# Patient Record
Sex: Female | Born: 1940 | Race: White | Hispanic: No | Smoking: Former smoker
Health system: Southern US, Community
[De-identification: ages and names within clinical notes are randomized; demographics above are authoritative.]

## PROBLEM LIST (undated history)

## (undated) DIAGNOSIS — E079 Disorder of thyroid, unspecified: Secondary | ICD-10-CM

## (undated) DIAGNOSIS — I10 Essential (primary) hypertension: Secondary | ICD-10-CM

## (undated) DIAGNOSIS — E785 Hyperlipidemia, unspecified: Secondary | ICD-10-CM

## (undated) HISTORY — PX: COLONOSCOPY: SHX174

---

## 2004-08-27 ENCOUNTER — Ambulatory Visit: Payer: Self-pay | Admitting: Internal Medicine

## 2005-11-18 ENCOUNTER — Ambulatory Visit: Payer: Self-pay | Admitting: Internal Medicine

## 2006-12-04 ENCOUNTER — Ambulatory Visit: Payer: Self-pay | Admitting: Family Medicine

## 2007-12-07 ENCOUNTER — Ambulatory Visit: Payer: Self-pay | Admitting: Internal Medicine

## 2009-01-23 ENCOUNTER — Ambulatory Visit: Payer: Self-pay | Admitting: Family Medicine

## 2011-11-14 ENCOUNTER — Ambulatory Visit: Payer: Self-pay | Admitting: Unknown Physician Specialty

## 2013-03-02 ENCOUNTER — Ambulatory Visit: Payer: Self-pay | Admitting: Family Medicine

## 2014-09-28 ENCOUNTER — Ambulatory Visit
Admission: EM | Admit: 2014-09-28 | Discharge: 2014-09-28 | Disposition: A | Discharge status: Home / Self Care | Payer: Medicare Other | Attending: Family Medicine | Admitting: Family Medicine

## 2014-09-28 ENCOUNTER — Encounter: Payer: Self-pay | Admitting: Emergency Medicine

## 2014-09-28 DIAGNOSIS — H6593 Unspecified nonsuppurative otitis media, bilateral: Secondary | ICD-10-CM

## 2014-09-28 DIAGNOSIS — I10 Essential (primary) hypertension: Secondary | ICD-10-CM | POA: Diagnosis not present

## 2014-09-28 DIAGNOSIS — J309 Allergic rhinitis, unspecified: Secondary | ICD-10-CM

## 2014-09-28 DIAGNOSIS — R040 Epistaxis: Secondary | ICD-10-CM

## 2014-09-28 HISTORY — DX: Essential (primary) hypertension: I10

## 2014-09-28 HISTORY — DX: Hyperlipidemia, unspecified: E78.5

## 2014-09-28 HISTORY — DX: Disorder of thyroid, unspecified: E07.9

## 2014-09-28 MED ORDER — SALINE SPRAY 0.65 % NA SOLN: 1.0000 | NASAL | Status: DC

## 2014-09-28 NOTE — Discharge Instructions (Signed)
Nosebleed Nosebleeds can be caused by many conditions, including trauma, infections, polyps, foreign bodies, dry mucous membranes or climate, medicines, and air conditioning. Most nosebleeds occur in the front of the nose. Because of this location, most nosebleeds can be controlled by pinching the nostrils gently and continuously for at least 10 to 20 minutes. The long, continuous pressure allows enough time for the blood to clot. If pressure is released during that 10 to 20 minute time period, the process may have to be started again. The nosebleed may stop by itself or quit with pressure, or it may need concentrated heating (cautery) or pressure from packing. HOME CARE INSTRUCTIONS   If your nose was packed, try to maintain the pack inside until your health care provider removes it. If a gauze pack was used and it starts to fall out, gently replace it or cut the end off. Do not cut if a balloon catheter was used to pack the nose. Otherwise, do not remove unless instructed.  Avoid blowing your nose for 12 hours after treatment. This could dislodge the pack or clot and start the bleeding again.  If the bleeding starts again, sit up and bend forward, gently pinching the front half of your nose continuously for 20 minutes.  If bleeding was caused by dry mucous membranes, use over-the-counter saline nasal spray or gel. This will keep the mucous membranes moist and allow them to heal. If you must use a lubricant, choose the water-soluble variety. Use it only sparingly and not within several hours of lying down.  Do not use Allergic Rhinitis Allergic rhinitis is when the mucous membranes in the nose respond to allergens. Allergens are particles in the air that cause your body to have an allergic reaction. This causes you to release allergic antibodies. Through a chain of events, these eventually cause you to release histamine into the blood stream. Although meant to protect the body, it is this release of  histamine that causes your discomfort, such as frequent sneezing, congestion, and an itchy, runny nose.  CAUSES  Seasonal allergic rhinitis (hay fever) is caused by pollen allergens that may come from grasses, trees, and weeds. Year-round allergic rhinitis (perennial allergic rhinitis) is caused by allergens such as house dust mites, pet dander, and mold spores.  SYMPTOMS  Nasal stuffiness (congestion). Itchy, runny nose with sneezing and tearing of the eyes. DIAGNOSIS  Your health care provider can help you determine the allergen or allergens that trigger your symptoms. If you and your health care provider are unable to determine the allergen, skin or blood testing may be used. TREATMENT  Allergic rhinitis does not have a cure, but it can be controlled by: Medicines and allergy shots (immunotherapy). Avoiding the allergen. Hay fever may often be treated with antihistamines in pill or nasal spray forms. Antihistamines block the effects of histamine. There are over-the-counter medicines that may help with nasal congestion and swelling around the eyes. Check with your health care provider before taking or giving this medicine.  If avoiding the allergen or the medicine prescribed do not work, there are many new medicines your health care provider can prescribe. Stronger medicine may be used if initial measures are ineffective. Desensitizing injections can be used if medicine and avoidance does not work. Desensitization is when a patient is given ongoing shots until the body becomes less sensitive to the allergen. Make sure you follow up with your health care provider if problems continue. HOME CARE INSTRUCTIONS It is not possible to completely avoid  allergens, but you can reduce your symptoms by taking steps to limit your exposure to them. It helps to know exactly what you are allergic to so that you can avoid your specific triggers. SEEK MEDICAL CARE IF:  You have a fever. You develop a cough that  does not stop easily (persistent). You have shortness of breath. You start wheezing. Symptoms interfere with normal daily activities. Document Released: 12/25/2000 Document Revised: 04/06/2013 Document Reviewed: 12/07/2012 [facility] Patient Information 2015 [place], [place]. This information is not intended to replace advice given to you by your health care provider. Make sure you discuss any questions you have with your health care provider. Otitis Media With Effusion Otitis media with effusion is the presence of fluid in the middle ear. This is a common problem in children, which often follows ear infections. It may be present for weeks or longer after the infection. Unlike an acute ear infection, otitis media with effusion refers only to fluid behind the ear drum and not infection. Children with repeated ear and sinus infections and allergy problems are the most likely to get otitis media with effusion. CAUSES  The most frequent cause of the fluid buildup is dysfunction of the eustachian tubes. These are the tubes that drain fluid in the ears to the back of the nose (nasopharynx). SYMPTOMS  The main symptom of this condition is hearing loss. As a result, you or your child may: Listen to the TV at a loud volume. Not respond to questions. Ask "what" often when spoken to. Mistake or confuse one sound or word for another. There may be a sensation of fullness or pressure but usually not pain. DIAGNOSIS  Your health care provider will diagnose this condition by examining you or your child's ears. Your health care provider may test the pressure in you or your child's ear with a tympanometer. A hearing test may be conducted if the problem persists. TREATMENT  Treatment depends on the duration and the effects of the effusion. Antibiotics, decongestants, nose drops, and cortisone-type drugs (tablets or nasal spray) may not be helpful. Children with persistent ear effusions may have delayed language or  behavioral problems. Children at risk for developmental delays in hearing, learning, and speech may require referral to a specialist earlier than children not at risk. You or your child's health care provider may suggest a referral to an ear, nose, and throat surgeon for treatment. The following may help restore normal hearing: Drainage of fluid. Placement of ear tubes (tympanostomy tubes). Removal of adenoids (adenoidectomy). HOME CARE INSTRUCTIONS  Avoid secondhand smoke. Infants who are breastfed are less likely to have this condition. Avoid feeding infants while they are lying flat. Avoid known environmental allergens. Avoid people who are sick. SEEK MEDICAL CARE IF:  Hearing is not better in 3 months. Hearing is worse. Ear pain. Drainage from the ear. Dizziness. MAKE SURE YOU:  Understand these instructions. Will watch your condition. Will get help right away if you are not doing well or get worse. Document Released: 05/09/2004 Document Revised: 08/16/2013 Document Reviewed: 10/27/2012 [facility] Patient Information 2015 [place], [place]. This information is not intended to replace advice given to you by your health care provider. Make sure you discuss any questions you have with your health care provider.   mineral oil, as these may drip into the lungs and cause serious problems.  Maintain humidity in your home by using less air conditioning or by using a humidifier.  Do not use aspirin or medicines which make bleeding more  likely. Your health care provider can give you recommendations on this.  Resume normal activities as you are able, but try to avoid straining, lifting, or bending at the waist for several days.  If the nosebleeds become recurrent and the cause is unknown, your health care provider may suggest laboratory tests. SEEK MEDICAL CARE IF: You have a fever. SEEK IMMEDIATE MEDICAL CARE IF:   Bleeding recurs and cannot be controlled.  There is unusual bleeding from  or bruising on other parts of the body.  Nosebleeds continue.  There is any worsening of the condition which originally brought you in.  You become light-headed, feel faint, become sweaty, or vomit blood. MAKE SURE YOU:   Understand these instructions.  Will watch your condition.  Will get help right away if you are not doing well or get worse. Document Released: 01/09/2005 Document Revised: 08/16/2013 Document Reviewed: 03/02/2009 [facility] Patient Information 2015 [place], [place]. This information is not intended to replace advice given to you by your health care provider. Make sure you discuss any questions you have with your health care provider. Hypertension Hypertension, commonly called high blood pressure, is when the force of blood pumping through your arteries is too strong. Your arteries are the blood vessels that carry blood from your heart throughout your body. A blood pressure reading consists of a higher number over a lower number, such as 110/72. The higher number (systolic) is the pressure inside your arteries when your heart pumps. The lower number (diastolic) is the pressure inside your arteries when your heart relaxes. Ideally you want your blood pressure below 120/80. Hypertension forces your heart to work harder to pump blood. Your arteries may become narrow or stiff. Having hypertension puts you at risk for heart disease, stroke, and other problems.  RISK FACTORS Some risk factors for high blood pressure are controllable. Others are not.  Risk factors you cannot control include:   Race. You may be at higher risk if you are African American.  Age. Risk increases with age.  Gender. Men are at higher risk than women before age 64 years. After age 34, women are at higher risk than men. Risk factors you can control include:  Not getting enough exercise or physical activity.  Being overweight.  Getting too much fat, sugar, calories, or salt in your diet.  Drinking  too much alcohol. SIGNS AND SYMPTOMS Hypertension does not usually cause signs or symptoms. Extremely high blood pressure (hypertensive crisis) may cause headache, anxiety, shortness of breath, and nosebleed. DIAGNOSIS  To check if you have hypertension, your health care provider will measure your blood pressure while you are seated, with your arm held at the level of your heart. It should be measured at least twice using the same arm. Certain conditions can cause a difference in blood pressure between your right and left arms. A blood pressure reading that is higher than normal on one occasion does not mean that you need treatment. If one blood pressure reading is high, ask your health care provider about having it checked again. TREATMENT  Treating high blood pressure includes making lifestyle changes and possibly taking medicine. Living a healthy lifestyle can help lower high blood pressure. You may need to change some of your habits. Lifestyle changes may include:  Following the DASH diet. This diet is high in fruits, vegetables, and whole grains. It is low in salt, red meat, and added sugars.  Getting at least 2 hours of brisk physical activity every week.  Losing weight  if necessary.  Not smoking.  Limiting alcoholic beverages.  Learning ways to reduce stress. If lifestyle changes are not enough to get your blood pressure under control, your health care provider may prescribe medicine. You may need to take more than one. Work closely with your health care provider to understand the risks and benefits. HOME CARE INSTRUCTIONS  Have your blood pressure rechecked as directed by your health care provider.   Take medicines only as directed by your health care provider. Follow the directions carefully. Blood pressure medicines must be taken as prescribed. The medicine does not work as well when you skip doses. Skipping doses also puts you at risk for problems.   Do not smoke.    Monitor your blood pressure at home as directed by your health care provider. SEEK MEDICAL CARE IF:   You think you are having a reaction to medicines taken.  You have recurrent headaches or feel dizzy.  You have swelling in your ankles.  You have trouble with your vision. SEEK IMMEDIATE MEDICAL CARE IF:  You develop a severe headache or confusion.  You have unusual weakness, numbness, or feel faint.  You have severe chest or abdominal pain.  You vomit repeatedly.  You have trouble breathing. MAKE SURE YOU:   Understand these instructions.  Will watch your condition.  Will get help right away if you are not doing well or get worse. Document Released: 04/01/2005 Document Revised: 08/16/2013 Document Reviewed: 01/22/2013 [facility] Patient Information 2015 [place], [place]. This information is not intended to replace advice given to you by your health care provider. Make sure you discuss any questions you have with your health care provider.

## 2014-09-28 NOTE — ED Notes (Signed)
Left sided nose bleed intermittently over this past winter. This morning the bleeding began after she got up. She states that yesterday the nose bled moderately.

## 2014-09-28 NOTE — ED Provider Notes (Signed)
CSN: [id number]     Arrival date & time 09/28/14  0850 History   First MD Initiated Contact with Patient 09/28/14 [phone]     Chief Complaint  Patient presents with  . Epistaxis   (Consider location/radiation/quality/duration/timing/severity/associated sxs/prior Treatment) HPI Comments: Elderly caucasian female with recurrent mild nosebleeds that started this winter.  Seasonal allergies have changed into year round allergies.  Rhinitis not controlled with claritin.  Usually applying pressure and tissue up nose stops bleeding.  Patient is a 74 y.o. female presenting with nosebleeds. The history is provided by the patient.  Epistaxis Location:  L nare Severity:  Mild Duration:  1 day Timing:  Intermittent Progression:  Unchanged Chronicity:  Recurrent Context: aspirin use, hypertension and weather change   Context: not anticoagulants, not BiPAP, not bleeding disorder, not CPAP, not drug use, not elevation change, not foreign body, not home oxygen, not nose picking, not recent infection, not thrombocytopenia and not trauma   Relieved by:  Applying pressure Worsened by:  Movement and sneezing Associated symptoms: congestion and sneezing   Associated symptoms: no blood in oropharynx, no cough, no dizziness, no facial pain, no fever, no headaches, no sinus pain, no sore throat and no syncope   Risk factors: allergies and frequent nosebleeds   Risk factors: no alcohol use, no change in medication, no head and neck surgery, no head and neck tumor, no intranasal steroids, no liver disease, no radiation treatment, no recent chemotherapy, no recent nasal surgery and no sinus problems     Past Medical History  Diagnosis Date  . Thyroid disease   . Hypertension   . Hyperlipidemia    History reviewed. No pertinent past surgical history. History reviewed. No pertinent family history. History  Substance Use Topics  . Smoking status: Former Research scientist (life sciences)  . Smokeless tobacco: Never Used  . Alcohol Use:  No   OB History    No data available     Review of Systems  Constitutional: Negative for fever, chills, diaphoresis, activity change, appetite change and fatigue.  HENT: Positive for congestion, nosebleeds, postnasal drip, rhinorrhea and sneezing. Negative for ear discharge, ear pain, facial swelling, hearing loss, mouth sores, sinus pressure, sore throat, tinnitus, trouble swallowing and voice change.   Eyes: Negative for photophobia, pain, discharge, redness, itching and visual disturbance.  Respiratory: Negative for cough, choking, shortness of breath, wheezing and stridor.   Cardiovascular: Negative for chest pain, palpitations, leg swelling and syncope.  Gastrointestinal: Negative for nausea, vomiting, diarrhea and blood in stool.  Endocrine: Negative for cold intolerance and heat intolerance.  Genitourinary: Negative for difficulty urinating.  Musculoskeletal: Negative for joint swelling, neck pain and neck stiffness.  Skin: Negative for color change, pallor, rash and wound.  Allergic/Immunologic: Positive for environmental allergies. Negative for food allergies and immunocompromised state.  Neurological: Negative for dizziness, tremors, seizures, syncope, facial asymmetry, speech difficulty, weakness, light-headedness, numbness and headaches.  Hematological: Negative for adenopathy. Bruises/bleeds easily.  Psychiatric/Behavioral: Negative for behavioral problems, confusion, sleep disturbance and agitation.    Allergies  Review of patient's allergies indicates no known allergies.  Home Medications   Prior to Admission medications   Medication Sig Start Date End Date Taking? Authorizing Provider  aspirin 81 MG tablet Take 81 mg by mouth daily.   Yes Historical Provider, MD  atenolol (TENORMIN) 25 MG tablet Take 25 mg by mouth daily.   Yes Historical Provider, MD  levothyroxine (SYNTHROID, LEVOTHROID) 25 MCG tablet Take 25 mcg by mouth daily before breakfast.   Yes Historical  Provider, MD  lisinopril (PRINIVIL,ZESTRIL) 10 MG tablet Take 10 mg by mouth daily.   Yes Historical Provider, MD  loratadine (CLARITIN) 10 MG tablet Take 10 mg by mouth daily.   Yes Historical Provider, MD  lovastatin (MEVACOR) 10 MG tablet Take 10 mg by mouth at bedtime.   Yes Historical Provider, MD  oxybutynin (DITROPAN-XL) 5 MG 24 hr tablet Take 5 mg by mouth at bedtime.   Yes Historical Provider, MD   BP 160/79 mmHg  Pulse 60  Temp(Src) 97.9 F (36.6 C) (Tympanic)  Resp 16  Ht 5\' 6"  (1.676 m)  Wt 143 lb (64.864 kg)  BMI 23.09 kg/m2  SpO2 100% Physical Exam  Constitutional: She is oriented to person, place, and time. Vital signs are normal. She appears well-developed and well-nourished. No distress.  HENT:  Head: Normocephalic and atraumatic.  Right Ear: Hearing and external ear normal. Tympanic membrane is injected. A middle ear effusion is present.  Left Ear: Hearing and external ear normal. Tympanic membrane is injected. A middle ear effusion is present.  Nose: Mucosal edema, rhinorrhea and sinus tenderness present. No nose lacerations, nasal deformity, septal deviation or nasal septal hematoma. Epistaxis is observed.  No foreign bodies. Right sinus exhibits no maxillary sinus tenderness and no frontal sinus tenderness. Left sinus exhibits no maxillary sinus tenderness and no frontal sinus tenderness.  Mouth/Throat: Oropharynx is clear and moist. No oropharyngeal exudate.  Kiesselbach plexus left nare with excoriation/dried black blood; bilateral TMs with air fluid level clear; cobblestoning posterior pharynx; external auditory canal with scaling/erythema; TM vasculature excoriated bilaterally; patient frequently wiping right nares clear discharge  Eyes: Conjunctivae, EOM and lids are normal. Pupils are equal, round, and reactive to light. Right eye exhibits no discharge. Left eye exhibits no discharge. No scleral icterus.  Neck: Trachea normal and normal range of motion. Neck  supple. No tracheal deviation present.  Cardiovascular: Normal rate, regular rhythm, normal heart sounds and intact distal pulses.  Exam reveals no gallop and no friction rub.   No murmur heard. Pulmonary/Chest: Effort normal and breath sounds normal. No stridor. No respiratory distress. She has no wheezes. She has no rales.  Abdominal: Soft. She exhibits no distension.  Musculoskeletal: Normal range of motion. She exhibits no edema or tenderness.  Lymphadenopathy:    She has no cervical adenopathy.  Neurological: She is alert and oriented to person, place, and time. Coordination normal.  Skin: Skin is warm, dry and intact. No rash noted. She is not diaphoretic. No erythema. No pallor.  Psychiatric: She has a normal mood and affect. Her speech is normal and behavior is normal. Judgment and thought content normal. Cognition and memory are normal.  Nursing note and vitals reviewed.   ED Course  Procedures (including critical care time) Labs Review Labs Reviewed - No data to display  Imaging Review No results found.   MDM   1. Epistaxis, recurrent   2. Otitis media with effusion, bilateral   3. Allergic rhinitis, unspecified allergic rhinitis type   consider changing from claritin to zyrtec or allegra. Other than nasal saline do not recommend any further nose sprays at this time.  On aspirin and blood pressure medications.  Kiesselbach plexus irritated still having rhinitis.  Discussed vaseline or mupriocin application in nares BID.  Suspect related to irritation from kleenex/allergies/rhinitis but could also be related to hypertension as BP elevated today.  Patient to keep BP log and follow up with PCM as may require adjustment of her atenolol/lisinopril.  Lean forward  apply ice to bridge of nose and hold pressure until bleeding stops.  Use humidifier in home or increase humidity level on HVAC unit at home.  Patient verbalized understanding of information/instructions, agreed with plan of  care and had no further questions at this time. Supportive treatment.   No evidence of invasive bacterial infection, non toxic and well hydrated.  This is most likely self limiting viral infection.  I do not see where any further testing or imaging is necessary at this time.   I will suggest supportive care, rest, good hygiene and encourage the patient to take adequate fluids.  The patient is to return to clinic or EMERGENCY ROOM if symptoms worsen or change significantly e.g. ear pain, fever, purulent discharge from ears or bleeding.  Exitcare handout on otitis media with effusion given to patient.  Patient verbalized agreement and understanding of treatment plan.   Patient may use normal saline nasal spray as needed.   Avoid triggers if possible.  Shower prior to bedtime if exposed to triggers.  If allergic dust/dust mites recommend mattress/pillow covers/encasements; washing linens, vacuuming, sweeping, dusting weekly.  Call or return to clinic as needed if these symptoms worsen or fail to improve as anticipated.   Exitcare handout on allergic rhinitis given to patient.  Patient verbalized understanding of instructions, agreed with plan of care and had no further questions at this time.  P2:  Avoidance and hand washing.    Olen Cordial, NP 09/28/14 [phone]

## 2017-07-24 ENCOUNTER — Other Ambulatory Visit: Payer: Self-pay | Admitting: Family Medicine

## 2017-07-24 DIAGNOSIS — Z1231 Encounter for screening mammogram for malignant neoplasm of breast: Secondary | ICD-10-CM

## 2017-07-30 ENCOUNTER — Ambulatory Visit
Admission: RE | Admit: 2017-07-30 | Discharge: 2017-07-30 | Disposition: A | Discharge status: Home / Self Care | Payer: Medicare Other | Source: Ambulatory Visit | Attending: Family Medicine | Admitting: Family Medicine

## 2017-07-30 DIAGNOSIS — Z1231 Encounter for screening mammogram for malignant neoplasm of breast: Secondary | ICD-10-CM | POA: Insufficient documentation

## 2019-04-12 ENCOUNTER — Other Ambulatory Visit: Payer: Self-pay | Admitting: Gerontology

## 2019-04-12 ENCOUNTER — Other Ambulatory Visit (HOSPITAL_COMMUNITY): Payer: Self-pay | Admitting: Gerontology

## 2019-04-12 DIAGNOSIS — G5131 Clonic hemifacial spasm, right: Secondary | ICD-10-CM

## 2019-04-13 ENCOUNTER — Other Ambulatory Visit: Payer: Self-pay

## 2019-04-13 ENCOUNTER — Ambulatory Visit
Admission: RE | Admit: 2019-04-13 | Discharge: 2019-04-13 | Disposition: A | Discharge status: Home / Self Care | Payer: Medicare Other | Source: Ambulatory Visit | Attending: Gerontology | Admitting: Gerontology

## 2019-04-13 DIAGNOSIS — G5131 Clonic hemifacial spasm, right: Secondary | ICD-10-CM | POA: Diagnosis not present

## 2019-04-13 MED ORDER — GADOBUTROL 1 MMOL/ML IV SOLN
6.0000 mL | Freq: Once | INTRAVENOUS | Status: AC | PRN
  Administered 2019-04-13: 6 mL via INTRAVENOUS

## 2019-04-22 ENCOUNTER — Other Ambulatory Visit: Payer: Self-pay | Admitting: Neurology

## 2019-04-22 DIAGNOSIS — G039 Meningitis, unspecified: Secondary | ICD-10-CM

## 2019-04-27 ENCOUNTER — Ambulatory Visit: Admission: RE | Admit: 2019-04-27 | Payer: Medicare Other | Source: Ambulatory Visit

## 2019-05-13 ENCOUNTER — Ambulatory Visit
Admission: RE | Admit: 2019-05-13 | Discharge: 2019-05-13 | Disposition: A | Discharge status: Home / Self Care | Payer: Medicare Other | Source: Ambulatory Visit | Attending: Neurology | Admitting: Neurology

## 2019-05-13 ENCOUNTER — Other Ambulatory Visit: Payer: Self-pay

## 2019-05-13 DIAGNOSIS — G039 Meningitis, unspecified: Secondary | ICD-10-CM | POA: Insufficient documentation

## 2019-05-13 MED ORDER — GADOBUTROL 1 MMOL/ML IV SOLN
6.0000 mL | Freq: Once | INTRAVENOUS | Status: AC | PRN
  Administered 2019-05-13: 6 mL via INTRAVENOUS

## 2019-05-14 ENCOUNTER — Encounter: Payer: Self-pay | Admitting: Hematology and Oncology

## 2019-05-14 ENCOUNTER — Other Ambulatory Visit: Payer: Self-pay

## 2019-05-14 NOTE — Progress Notes (Signed)
Confirmed Name and DOB. Referred by Dr. Manuella Ghazi. Denies any concerns.

## 2019-05-16 NOTE — Progress Notes (Signed)
[facility]  [address] [place], [place] [address] Phone: [phone]  Fax: [phone]   Clinic Day:  05/17/2019  Referring physician: Vladimir Crofts, MD  Chief Complaint: Katherine Shields is a 79 y.o. female with a monoclonal gammopathy who is referred in consultation by Dr. Jennings Books for assessment and management.   HPI:  The patient was referred to Dr. 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  The patient noted that the right side of her face has been drawing up and fluttering since 03/2019.  Symptoms occurred gradually over time then daily.  Head MRI on 04/13/2019 showed generalized dural thickening which encroached on the internal auditory canals and could explain the history. A cause of the dural thickening was not seen.  There was no evidence of brain sagging, venous occlusion, or posttraumatic pattern. This smooth thickening was more suggestive of a reactive/inflammatory process than a neoplasm.  There was a small focus of nodular enhancement posterior to the right V4 segment just beyond the dura, likely benign enhancing lesion of the foramen magnum.  Botox injections were discussed.  She was started on baclofen.  Plan was to rule out Sjogren's syndrome, neurosarcoidosis, vasculitis, intracranial hypotension, TB for the pachymeningitis.  A work-up was ordered.  Referral to neurosurgery was discussed.  CBC on 04/20/2019 revealed a hematocrit 39.2, hemoglobin 13.0, MCV 97.3, platelets 220,000, and WBC 5500 with an [place] of 4200.  Calcium was 9.6.  Creatinine was 0.7.  Protein was 7.3 with an albumin of 4.2.  SPEP revealed a 0.6 gm/dL IgG monoclonal protein with kappa light chain specificity.  IgG was 1631 with an IgA of 188 and an IgM of 30.  Normal labs included: Vitamin D, 25-hydroxy, B12, ANA, RA, treponema antibodies, MPO antibodies, C-ANCA, P-ANCA, PT, PTT, lupus anticoagulant  panel, beta2 glycoprotein antibodies, Lyme titer, HTLV I-II, Quantiferon gold, Sjogren's antibodies, ACE level, SARS-COVID antibody IgG, TSH, and sed rate.  Cervical spine MRI on 05/13/2019 revealed dural thickening and smooth enhancement extending into the proximal spinal canal to C3.  Findings were similar to the prior study.  Acute or chronic meningitis was a consideration as well as tumor infiltration.  Correlation with CSF sampling was noted.  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  CBC followed 09/10/2016: Hematocrit 36.8, hemoglobin 12.5, MCV 95.6, WBC 5400, ANC 3900.  09/12/2017: Hematocrit 40.5, hemoglobin 12.9, MCV 99.0, WBC 5600, ANC 3720.  09/22/2018: Hematocrit 38.8, hemoglobin 13.0, MCV 96.8, WBC 6200, ANC 4600.  04/20/2019: Hematocrit 39.2, hemoglobin 13.0, MCV 97.3, WBC 5500, ANC 4200.   SPEP on 04/20/2019 revealed an M spike of 0.6 gm/dL.  IgG was 1631 [phone]).  Symptomatically, she feels "ok".  She denies any headaches, blurry vision, changes in balance or coordination. She has been taking her BP medication at night instead in the morning.   She has no family history of blood disorder or cancer.   Past Medical History:  Diagnosis Date  . Hyperlipidemia   . Hypertension   . Thyroid disease     History reviewed. No pertinent surgical history.  Family History  Problem Relation Age of Onset  . Hypertension Father   . Breast cancer Neg Hx     Social History:  reports that she quit smoking about 41 years ago. She has never used smokeless  tobacco. She reports that she does not drink alcohol or use drugs.She quit smoking in 1980. The patient is alone today.  Allergies:  Allergies  Allergen Reactions  . Alendronate     Other reaction(s): Unknown  . Risedronate     Other reaction(s): Unknown  . Simvastatin     Other reaction(s): Unknown     Current Medications: Current Outpatient Medications  Medication Sig Dispense Refill  . aspirin 81 MG tablet Take 81 mg by mouth daily.    Marland Kitchen atenolol (TENORMIN) 25 MG tablet Take 25 mg by mouth daily.    . baclofen (LIORESAL) 10 MG tablet Take 1 tablet by mouth as needed.    Marland Kitchen levothyroxine (SYNTHROID, LEVOTHROID) 25 MCG tablet Take 25 mcg by mouth daily before breakfast.    . lisinopril (PRINIVIL,ZESTRIL) 10 MG tablet Take 10 mg by mouth daily.    Marland Kitchen loratadine (CLARITIN) 10 MG tablet Take 10 mg by mouth daily.    Marland Kitchen losartan (COZAAR) 100 MG tablet Take 100 mg by mouth daily.     Marland Kitchen lovastatin (MEVACOR) 10 MG tablet Take 10 mg by mouth at bedtime.    . Multiple Vitamins-Minerals (MULTIVITAMIN GUMMIES ADULT PO) Take 1 tablet by mouth daily.    . Multiple Vitamins-Minerals (PRESERVISION AREDS 2 PO) Take 1 tablet by mouth daily.     Marland Kitchen oxybutynin (DITROPAN-XL) 5 MG 24 hr tablet Take 5 mg by mouth at bedtime.    . raloxifene (EVISTA) 60 MG tablet Take 60 mg by mouth daily.      No current facility-administered medications for this visit.    Review of Systems  Constitutional: Negative for chills, diaphoresis, fever, malaise/fatigue and weight loss.       Feels "ok".  HENT: Negative.  Negative for congestion, ear pain, hearing loss, nosebleeds, sinus pain and sore throat.   Eyes: Negative.  Negative for blurred vision and double vision.  Respiratory: Negative.  Negative for cough, sputum production, shortness of breath and wheezing.   Cardiovascular: Negative.  Negative for chest pain, palpitations and leg swelling.       Hypertension.  Gastrointestinal: Negative.  Negative for abdominal pain, blood in stool, constipation, diarrhea, heartburn, melena, nausea and vomiting.  Genitourinary: Negative.  Negative for dysuria, frequency, hematuria and urgency.  Musculoskeletal: Negative.  Negative for back pain, joint pain, myalgias and neck pain.  Skin: Negative.  Negative for rash.  Neurological:  Negative for dizziness, sensory change, focal weakness, seizures, weakness and headaches.       Right hemifacial spasms.  Endo/Heme/Allergies: Positive for environmental allergies (rhinorrhea, sneezing). Does not bruise/bleed easily.       Hypothyroid on Synthroid.  Psychiatric/Behavioral: Negative.  Negative for depression and memory loss. The patient does not have insomnia.    Performance status (ECOG): 0  Vitals Pulse 63, temperature (!) 96.1 F (35.6 C), temperature source Tympanic, resp. rate 18, height 5\' 5"  (1.651 m), weight 123 lb 2 oz (55.8 kg), SpO2 100 %.   Physical Exam  Constitutional: She is oriented to person, place, and time. She appears well-developed and well-nourished. No distress.  HENT:  Head: Normocephalic and atraumatic.  Mask.  Eyes: Conjunctivae and EOM are normal. No scleral icterus.  Musculoskeletal:        General: No edema.     Comments: Kyphosis.  Neurological: She is alert and oriented to person, place, and time.  Skin: She is not diaphoretic.  Psychiatric: She has a normal mood and affect. Her behavior is normal. Judgment and  thought content normal.  Nursing note and vitals reviewed.   No visits with results within 3 Day(s) from this visit.  Latest known visit with results is:  No results found for any previous visit.    Assessment:  Katherine Shields is a 79 y.o. female with an IgG monoclonal gammopathy.  She presented with right hemifacial spasms beginning in 03/2019.   Head MRI on 04/13/2019 showed generalized dural thickening which encroached on the internal auditory canals and could explain the history. A cause of the dural thickening was not seen.  There was no evidence of brain sagging, venous occlusion, or posttraumatic pattern. This smooth thickening was more suggestive of a reactive/inflammatory process than a neoplasm.  There was a small focus of nodular enhancement posterior to the right V4 segment just beyond the dura, likely benign enhancing  lesion of the foramen magnum.  Work-up on 04/20/2019 revealed a hematocrit 39.2, hemoglobin 13.0, MCV 97.3, platelets 220,000, and WBC 5500 with an [place] of 4200.  Calcium was 9.6.  Creatinine was 0.7.  Protein was 7.3 with an albumin of 4.2.  SPEP revealed a 0.6 gm/dL IgG monoclonal protein with kappa light chain specificity.  IgG was 1631 with an IgA of 188 and an IgM of 30.  Normal labs included: Vitamin D, 25-hydroxy, B12, ANA, RA, treponema antibodies, MPO antibodies, C-ANCA, P-ANCA, PT, PTT, lupus anticoagulant panel, beta2 glycoprotein antibodies, Lyme titer, HTLV I-II, Quantiferon gold, Sjogren's antibodies, ACE level, SARS-COVID antibody IgG, TSH, and sed rate.  Cervical spine MRI on 05/13/2019 revealed dural thickening and smooth enhancement extending into the proximal spinal canal to C3.  Findings are similar to the prior study.  Acute or chronic meningitis was a consideration as well as tumor infiltration.  Correlation with CSF sampling was noted.  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  Symptomatically, she feels "ok".  Blood pressure is 217/94.  Plan: 1.   IgG monoclonal gammopathy  Suspect a monoclonal gammopathy of unknown significance.  Discuss CRAB criteria- hypercalcemia, renal disease, anemia, bone lesions.  Plan for labs this week:  CBC with diff, BMP, myeloma panel, FLCA, beta2-microglobulin.  Collect 24 hour urine for UPEP and free light chains.  Anticipate bone survey to assess lytic lesions. 2.   Enhancing lesion at skull base  Discussed with Dr Izora Ribas.  Patient will be seen in the [facility] neurosurgery clinic.  3.   Severe hypertension  Blood pressure 217/94 and 201/101.  Patient to ER now for HTN- report called. 4.   RTC 10-14 days after labs collected for MD assessment and review of work-up.  I discussed the assessment and treatment plan with the  patient.  The patient was provided an opportunity to ask questions and all were answered.  The patient agreed with the plan and demonstrated an understanding of the instructions.  The patient was advised to call back if the symptoms worsen or if the condition fails to improve as anticipated.  I provided 11 minutes (10:58 AM - 11:09 AM) of face-to-face time during this this encounter and > 50% was spent counseling as documented under my assessment and plan.   An additional 15 minutes were spent reviewing her chart (Epic and Care Everywhere) including notes, labs, and imaging studies.  In adittion, I spoke with Dr Izora Ribas, neurosurgeon, as well as called report to the [facility] ER.   Melissa C. Mike Gip,  MD, PhD    05/17/2019, 11:09 AM  I, Samul Dada, am acting as scribe for [employer]. Mike Gip, MD, PhD.  I, Melissa C. Mike Gip, MD, have reviewed the above documentation for accuracy and completeness, and I agree with the above.

## 2019-05-17 ENCOUNTER — Encounter: Payer: Self-pay | Admitting: Hematology and Oncology

## 2019-05-17 ENCOUNTER — Other Ambulatory Visit: Payer: Self-pay

## 2019-05-17 ENCOUNTER — Emergency Department
Admission: EM | Admit: 2019-05-17 | Discharge: 2019-05-17 | Disposition: A | Discharge status: Home / Self Care | Payer: Medicare Other | Attending: Emergency Medicine | Admitting: Emergency Medicine

## 2019-05-17 ENCOUNTER — Encounter: Payer: Self-pay | Admitting: Emergency Medicine

## 2019-05-17 ENCOUNTER — Inpatient Hospital Stay: Payer: Medicare Other | Attending: Hematology and Oncology | Admitting: Hematology and Oncology

## 2019-05-17 ENCOUNTER — Telehealth: Payer: Self-pay

## 2019-05-17 VITALS — HR 63 | Temp 96.1°F | Resp 18 | Ht 65.0 in | Wt 123.1 lb

## 2019-05-17 DIAGNOSIS — M47812 Spondylosis without myelopathy or radiculopathy, cervical region: Secondary | ICD-10-CM | POA: Insufficient documentation

## 2019-05-17 DIAGNOSIS — I10 Essential (primary) hypertension: Secondary | ICD-10-CM | POA: Diagnosis not present

## 2019-05-17 DIAGNOSIS — Z79899 Other long term (current) drug therapy: Secondary | ICD-10-CM | POA: Insufficient documentation

## 2019-05-17 DIAGNOSIS — D472 Monoclonal gammopathy: Secondary | ICD-10-CM | POA: Insufficient documentation

## 2019-05-17 DIAGNOSIS — Z87891 Personal history of nicotine dependence: Secondary | ICD-10-CM | POA: Diagnosis not present

## 2019-05-17 DIAGNOSIS — Z8249 Family history of ischemic heart disease and other diseases of the circulatory system: Secondary | ICD-10-CM | POA: Insufficient documentation

## 2019-05-17 LAB — CBC
HCT: 38.5 % (ref 36.0–46.0)
Hemoglobin: 13 g/dL (ref 12.0–15.0)
MCH: 32.3 pg (ref 26.0–34.0)
MCHC: 33.8 g/dL (ref 30.0–36.0)
MCV: 95.5 fL (ref 80.0–100.0)
Platelets: 204 10*3/uL (ref 150–400)
RBC: 4.03 MIL/uL (ref 3.87–5.11)
RDW: 13 % (ref 11.5–15.5)
WBC: 6 10*3/uL (ref 4.0–10.5)
nRBC: 0 % (ref 0.0–0.2)

## 2019-05-17 LAB — BASIC METABOLIC PANEL
Anion gap: 11 (ref 5–15)
BUN: 9 mg/dL (ref 8–23)
CO2: 30 mmol/L (ref 22–32)
Calcium: 9.2 mg/dL (ref 8.9–10.3)
Chloride: 101 mmol/L (ref 98–111)
Creatinine, Ser: 0.76 mg/dL (ref 0.44–1.00)
GFR calc Af Amer: >60 mL/min (ref >60)
GFR calc non Af Amer: >60 mL/min (ref >60)
Glucose, Bld: 97 mg/dL (ref 70–99)
Potassium: 3.7 mmol/L (ref 3.5–5.1)
Sodium: 142 mmol/L (ref 135–145)

## 2019-05-17 MED ORDER — LISINOPRIL 10 MG PO TABS
10.0000 mg | ORAL_TABLET | Freq: Once | ORAL | Status: AC
  Administered 2019-05-17: 10 mg via ORAL
  Filled 2019-05-17: qty 1

## 2019-05-17 MED ORDER — LOSARTAN POTASSIUM 50 MG PO TABS
25.0000 mg | ORAL_TABLET | ORAL | Status: AC
  Administered 2019-05-17: 25 mg via ORAL
  Filled 2019-05-17: qty 1

## 2019-05-17 MED ORDER — LOSARTAN POTASSIUM 50 MG PO TABS: 50.0000 mg | ORAL_TABLET | ORAL | Status: DC

## 2019-05-17 NOTE — ED Provider Notes (Signed)
[facility] Emergency Department Provider Note   ____________________________________________   None    (approximate)  I have reviewed the triage vital signs and the nursing notes.   HISTORY  Chief Complaint Hypertension    HPI Katherine Shields is a 79 y.o. female here for evaluation of elevated blood pressure  Patient reports that she went to the cancer center today to have  visit for follow-up and further work-up regarding her MRI.  However, they noticed her blood pressure was high and they referred her to the ER to have this looked at  Denies any symptoms.  Reports she is taking her blood pressure medicine, takes it all in the evenings last took last night.  No headache no fevers no chills.  No cough no chest pain no trouble breathing.  No leg swelling.  She denies having any symptoms at all, but was referred here to be seen because her blood pressure was high.  She suffered from high blood pressure for some time now.  Continues take lisinopril and losartan  Past Medical History:  Diagnosis Date  . Hyperlipidemia   . Hypertension   . Thyroid disease     Patient Active Problem List   Diagnosis Date Noted  . IgG monoclonal gammopathy 05/17/2019    History reviewed. No pertinent surgical history.  Prior to Admission medications   Medication Sig Start Date End Date Taking? Authorizing Provider  aspirin 81 MG tablet Take 81 mg by mouth daily.    [provider]  atenolol (TENORMIN) 25 MG tablet Take 25 mg by mouth daily.    [provider]  baclofen (LIORESAL) 10 MG tablet Take 1 tablet by mouth as needed. 04/20/19 04/19/20  [provider]  levothyroxine (SYNTHROID, LEVOTHROID) 25 MCG tablet Take 25 mcg by mouth daily before breakfast.    [provider]  lisinopril (PRINIVIL,ZESTRIL) 10 MG tablet Take 10 mg by mouth daily.    [provider]  loratadine (CLARITIN) 10 MG tablet Take 10 mg by mouth daily.     [provider]  losartan (COZAAR) 100 MG tablet Take 100 mg by mouth daily.  03/24/19   [provider]  lovastatin (MEVACOR) 10 MG tablet Take 10 mg by mouth at bedtime.    [provider]  Multiple Vitamins-Minerals (MULTIVITAMIN GUMMIES ADULT PO) Take 1 tablet by mouth daily.    [provider]  Multiple Vitamins-Minerals (PRESERVISION AREDS 2 PO) Take 1 tablet by mouth daily.     [provider]  oxybutynin (DITROPAN-XL) 5 MG 24 hr tablet Take 5 mg by mouth at bedtime.    [provider]  raloxifene (EVISTA) 60 MG tablet Take 60 mg by mouth daily.  03/24/19   [provider]    Allergies Alendronate, Risedronate, and Simvastatin  Family History  Problem Relation Age of Onset  . Hypertension Father   . Breast cancer Neg Hx     Social History Social History   Tobacco Use  . Smoking status: Former Smoker    Quit date: 1980    Years since quitting: 41.1  . Smokeless tobacco: Never Used  Substance Use Topics  . Alcohol use: No  . Drug use: No    Review of Systems Constitutional: No fever/chills or exposure to Covid Eyes: No visual changes.  No blurring of vision or change in vision. ENT: No sore throat. Cardiovascular: Denies chest pain. Respiratory: Denies shortness of breath. Gastrointestinal: No abdominal pain.   Neurological: Negative  for headaches, areas of focal weakness or numbness.    ____________________________________________   PHYSICAL EXAM:  VITAL SIGNS: ED Triage Vitals  Enc Vitals Group     BP 05/17/19 1226 (!) 213/98     Pulse Rate 05/17/19 1226 64     Resp 05/17/19 1226 16     Temp 05/17/19 1226 98.8 F (37.1 C)     Temp Source 05/17/19 1226 Oral     SpO2 05/17/19 1226 98 %     Weight 05/17/19 1228 124 lb (56.2 kg)     Height 05/17/19 1228 5' 5"  (1.651 m)     Head Circumference --      Peak Flow --      Pain Score 05/17/19 1226 0     Pain Loc --      Pain Edu? --      Excl. in  [place]? --     Constitutional: Alert and oriented. Well appearing and in no acute distress.  Very pleasant.  Sitting upright without distress. Eyes: Conjunctivae are normal. Head: Atraumatic. Nose: No congestion/rhinnorhea. Mouth/Throat: Mucous membranes are moist. Neck: No stridor.  Cardiovascular: Normal rate, regular rhythm. Grossly normal heart sounds.  Good peripheral circulation. Respiratory: Normal respiratory effort.  No retractions. Lungs CTAB. Gastrointestinal: Soft and nontender. No distention. Musculoskeletal: No lower extremity tenderness nor edema. Neurologic:  Normal speech and language. No gross focal neurologic deficits are appreciated.  Skin:  Skin is warm, dry and intact. No rash noted. Psychiatric: Mood and affect are normal. Speech and behavior are normal.  ____________________________________________   LABS (all labs ordered are listed, but only abnormal results are displayed)  Labs Reviewed  CBC  BASIC METABOLIC PANEL  MULTIPLE MYELOMA PANEL, SERUM  KAPPA/LAMBDA LIGHT CHAINS  BETA 2 MICROGLOBULIN, SERUM   ____________________________________________  EKG  ED ECG REPORT I, Delman Kitten, the attending physician, personally viewed and interpreted this ECG.  Date: 05/17/2019 EKG Time: 1235 Rate: 65 Rhythm: normal sinus rhythm QRS Axis: normal Intervals: normal ST/T Wave abnormalities: normal Narrative Interpretation: no evidence of acute ischemia  ____________________________________________  RADIOLOGY   ____________________________________________   PROCEDURES  Procedure(s) performed: None  Procedures  Critical Care performed: No  ____________________________________________   INITIAL IMPRESSION / ASSESSMENT AND PLAN / ED COURSE  Pertinent labs & imaging results that were available during my care of the patient were reviewed by me and considered in my medical decision making (see chart for details).   Patient presents for asymptomatic  hypertension.  Confirmed on reassessment though, she does have significant hypertension blood pressure initially just over 201 systolic, on reassessment 192.  She is completely asymptomatic with normal EKG.  She lacks any symptoms of hypertensive emergency.  She does take her blood pressure medicine in the evenings, will trial low-dose of losartan and lisinopril here monitor for improvement.  Contacted Dr. Mike Gip regarding lab orders the patient was not able to have her labs drawn at the outpatient clinic today.  Will check CBC BMP.    Clinical Course as of May 16 1704  Mon May 17, 2019  1654 Patient resting comfortably, no complaints.  Patient would like to be able to go home soon.  Await repeat blood pressure   [MQ]    Clinical Course User Index [MQ] Delman Kitten, MD    ----------------------------------------- 5:06 PM on 05/17/2019 -----------------------------------------  Mild improvement in patient's blood pressure.  We will have her resume her normal medications this evening, and call her doctor's office tomorrow for further treatment recommendations and  blood pressure management.  She is asymptomatic at this time. ____________________________________________   FINAL CLINICAL IMPRESSION(S) / ED DIAGNOSES  Final diagnoses:  Hypertension, unspecified type        Note:  This document was prepared using Dragon voice recognition software and may include unintentional dictation errors       Delman Kitten, MD 05/17/19 1706

## 2019-05-17 NOTE — Discharge Instructions (Addendum)
Please resume your normal medications including her blood pressure medications this evening  Call your doctor's office tomorrow for further recommendations on any potential adjustments of your blood pressure medicine.  As we discussed, though you do have high blood pressure (hypertension), fortunately it is not immediately dangerous at this time and does not need emergency intervention or admission to the hospital.  If we add to or change your regular medications, we may cause more harm than good - it is more appropriate for your primary care doctor to evaluate you in clinic and decide if any medication changes are needed.  Please follow up in clinic as recommended in these papers.  Return to the Emergency Department (ED) if you experience any chest pain/pressure/tightness, difficulty breathing, or sudden sweating, or other symptoms that concern you.

## 2019-05-17 NOTE — ED Triage Notes (Signed)
Pt in via [place], being seen at [name] today for routine appointment, sent here due to elevated BP.  Ambulatory to triage, denies any further complaints, NAD noted at this time.

## 2019-05-17 NOTE — Telephone Encounter (Signed)
Spoke with Dr Payton Mccallum office about thei patient b/p today in office first reading 217/94 right arm / 204/109 in left arm. Ms Katherine Shields is who i spoke with at the office which informed me that the patient need to go to the ED now due to the high blood pressure. Dr Mike Gip and the patient has been informed.

## 2019-05-17 NOTE — ED Triage Notes (Signed)
First Nurse Note:  C/O high blood pressure.  Sent to ED for evaluation by PCP.  Patient is AAOx3.  Skin warm and dry. NAD

## 2019-05-18 LAB — KAPPA/LAMBDA LIGHT CHAINS
Kappa free light chain: 43.8 mg/L — ABNORMAL HIGH (ref 3.3–19.4)
Kappa, lambda light chain ratio: 3.17 — ABNORMAL HIGH (ref 0.26–1.65)
Lambda free light chains: 13.8 mg/L (ref 5.7–26.3)

## 2019-05-18 LAB — BETA 2 MICROGLOBULIN, SERUM: Beta-2 Microglobulin: 2.2 mg/L (ref 0.6–2.4)

## 2019-05-19 LAB — MULTIPLE MYELOMA PANEL, SERUM
Albumin SerPl Elph-Mcnc: 3.8 g/dL (ref 2.9–4.4)
Albumin/Glob SerPl: 1.1 (ref 0.7–1.7)
Alpha 1: 0.3 g/dL (ref 0.0–0.4)
Alpha2 Glob SerPl Elph-Mcnc: 0.7 g/dL (ref 0.4–1.0)
B-Globulin SerPl Elph-Mcnc: 1.5 g/dL — ABNORMAL HIGH (ref 0.7–1.3)
Gamma Glob SerPl Elph-Mcnc: 1 g/dL (ref 0.4–1.8)
Globulin, Total: 3.6 g/dL (ref 2.2–3.9)
IgA: 181 mg/dL (ref 64–422)
IgG (Immunoglobin G), Serum: 1594 mg/dL (ref 586–1602)
IgM (Immunoglobulin M), Srm: 36 mg/dL (ref 26–217)
M Protein SerPl Elph-Mcnc: 0.6 g/dL — ABNORMAL HIGH
Total Protein ELP: 7.4 g/dL (ref 6.0–8.5)

## 2019-06-07 ENCOUNTER — Telehealth: Payer: Self-pay

## 2019-06-07 NOTE — Telephone Encounter (Signed)
Spoke with the patient and she is not agreeable to get a 24 hour urine at this times she states she need to see another MD before coming back to the office. The patient states she will call back and schedule appointment.

## 2019-06-08 ENCOUNTER — Other Ambulatory Visit: Payer: Self-pay | Admitting: Hematology and Oncology

## 2019-06-16 ENCOUNTER — Other Ambulatory Visit: Payer: Self-pay

## 2019-06-16 DIAGNOSIS — D472 Monoclonal gammopathy: Secondary | ICD-10-CM

## 2019-06-18 LAB — UPEP/UIFE/LIGHT CHAINS/TP, 24-HR UR
% BETA, Urine: 0 %
ALPHA 1 URINE: 0 %
Albumin, U: 0 %
Alpha 2, Urine: 0 %
Free Kappa Lt Chains,Ur: 11.88 mg/L (ref 0.63–113.79)
Free Kappa/Lambda Ratio: >16.97 (ref 1.03–31.76)
Free Lambda Lt Chains,Ur: <0.7 mg/L (ref 0.47–11.77)
GAMMA GLOBULIN URINE: 0 %
Total Protein, Urine-Ur/day: 192 mg/24 hr — ABNORMAL HIGH (ref 30–150)
Total Protein, Urine: 7.1 mg/dL
Total Volume: 2700

## 2019-06-18 LAB — UPEP/TP, 24-HR URINE
Albumin, U: 0 %
Alpha 1, Urine: 0 %
Alpha 2, Urine: 0 %
Beta, Urine: 0 %
Gamma Globulin, Urine: 0 %
Total Protein, Urine-Ur/day: 208 mg/24 hr — ABNORMAL HIGH (ref 30–150)
Total Protein, Urine: 7.7 mg/dL
Total Volume: 2700

## 2019-06-27 NOTE — Progress Notes (Signed)
[facility]  [address] [place], [place] [address] Phone: [phone]  Fax: [phone]   Clinic Day:  06/29/2019  Referring physician: Alanson Aly, FNP  Chief Complaint: Katherine Shields is a 79 y.o. female with a monoclonal gammopathy who is seen for review of work up and discussion regarding therapy.   HPI: The patient was last seen in the medical oncology clinic on 05/17/2019 for an initial consultation. 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  Symptomatically, she felt "ok". Blood pressure was 217/94. Patient was advised to go to the ER for hypertension.   Patient was seen in the ER by Dr. Delman Kitten on 05/17/2019 for hypertension. She was taking her blood pressure medication as prescribed. She has no other symptoms. EKG was normal. Patient was given low dose losartan and lisinopril.   Work up on 05/17/2019 included a hematocrit 38.5, hemoglobin 13.0, platelets 204,000, WBC 6,000.  Creatinine was 0.76 and calcium 9.2. SPEP revealed a 0.6 gm/dL IgG monoclonal protein with kappa light chain specificity. Kappa free light chains were 43.58 with ratio of 3.17 (0.26-3.17).  Beta-2 microglobulin was 2.2.  24 hour UPEP on 06/16/2019 showed a total protein 208 mg/24hr repeat (192 mg/24 hr).  There was no monoclonal protein.  Patient had an initial consult with Dr. Izora Ribas, neurosurgeon, on 05/27/2019.  She was noted to have dural thickening on her head MRI suggestive of spinal fluid hypotension.  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   She was asymptomatic from intracranial hypotension.  She also had an enhancing lesion near the skull base on the right side.  This was in the area of the right spinal accessory nerve.  Dr. Cari Caraway suspected this was a schwannoma and was unlikely  to change.  Plan was for reimaging in 4-6 months.  If she develops symptoms of intracranial hypotension or if facial spasms become uncontrollable, he discussed work-up and possible intervention for her spinal fluid hypotension.  As she was asymptomatic, no intervention was recommended.    During the interim, she has felt "ok". Her energy is pretty good. She is active and walks for exercise. She denies any headaches or visual changes. Reports a follow up with the eye doctor on 06/28/2019 for macular degeneration and the exam was stable. She denies any cough, chest pains or shortness of breath. She has no bone or joint pain. She denies any abdominal problems. 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 She notes a runny nose secondary to allergies.   Her BP was 194/123 in clinic (repeat BP was 193/99). Reports taking BP medications nightly. Patient has a blood pressure cuff at home; her BP runs about 168. Denies feeling stressed at the doctors office. PCP was made aware. Patient was recommended to go to the ER.   She received the first COVID-19 vaccine on 06/13/2019 and will receive the second dose on 07/11/2019. She notes feeling sore x 1 day after the first dose.    Past Medical History:  Diagnosis Date  . Hyperlipidemia   . Hypertension   . Thyroid disease     History reviewed. No pertinent surgical history.  Family History  Problem Relation Age of Onset  . Hypertension Father   . Breast cancer Neg Hx     Social History:  reports that  she quit smoking about 41 years ago. She has never used smokeless tobacco. She reports that she does not drink alcohol or use drugs. She quit smoking in 1980. The patient is alone today.  Allergies:  Allergies  Allergen Reactions  . Alendronate     Other reaction(s): Unknown  . Risedronate     Other reaction(s): Unknown  . Simvastatin     Other reaction(s): Unknown    Current Medications: Current Outpatient Medications  Medication Sig  Dispense Refill  . aspirin 81 MG tablet Take 81 mg by mouth daily.    . baclofen (LIORESAL) 10 MG tablet Take 1 tablet by mouth as needed.    Marland Kitchen levothyroxine (SYNTHROID, LEVOTHROID) 25 MCG tablet Take 25 mcg by mouth daily before breakfast.    . loratadine (CLARITIN) 10 MG tablet Take 10 mg by mouth daily.    Marland Kitchen losartan (COZAAR) 100 MG tablet Take 100 mg by mouth daily.     Marland Kitchen lovastatin (MEVACOR) 10 MG tablet Take 10 mg by mouth at bedtime.    . Multiple Vitamins-Minerals (MULTIVITAMIN GUMMIES ADULT PO) Take 1 tablet by mouth daily.    . Multiple Vitamins-Minerals (PRESERVISION AREDS 2 PO) Take 1 tablet by mouth daily.     Marland Kitchen oxybutynin (DITROPAN-XL) 5 MG 24 hr tablet Take 5 mg by mouth at bedtime.    . raloxifene (EVISTA) 60 MG tablet Take 60 mg by mouth daily.     Marland Kitchen atenolol (TENORMIN) 25 MG tablet Take 25 mg by mouth daily.    Marland Kitchen lisinopril (PRINIVIL,ZESTRIL) 10 MG tablet Take 10 mg by mouth daily.     No current facility-administered medications for this visit.    Review of Systems  Constitutional: Negative for chills, diaphoresis, fever, malaise/fatigue and weight loss (stable).       Feels "ok". Good energy. Active.  HENT: Negative.  Negative for congestion, ear pain, hearing loss, nosebleeds, sinus pain and sore throat.   Eyes: Negative.  Negative for blurred vision and double vision.       Macular degeneration.  Respiratory: Negative.  Negative for cough, sputum production, shortness of breath and wheezing.   Cardiovascular: Negative.  Negative for chest pain, palpitations and leg swelling.       Hypertension.  Gastrointestinal: Negative.  Negative for abdominal pain, blood in stool, constipation, diarrhea, heartburn, melena, nausea and vomiting.  Genitourinary: Negative.  Negative for dysuria, frequency, hematuria and urgency.  Musculoskeletal: Negative.  Negative for back pain, joint pain, myalgias and neck pain.  Skin: Negative.  Negative for rash.  Neurological: Negative for  dizziness, sensory change, focal weakness, seizures, weakness and headaches.       Right hemifacial spasms are better; on baclofen.  Endo/Heme/Allergies: Positive for environmental allergies (rhinorrhea). Does not bruise/bleed easily.       Hypothyroid on Synthroid.  Psychiatric/Behavioral: Negative.  Negative for depression and memory loss. The patient does not have insomnia.   All other systems reviewed and are negative.  Performance status (ECOG): 0-1  Vitals Blood pressure (!) 194/123, pulse 89, temperature 98.2 F (36.8 C), temperature source Tympanic, resp. rate 18, height 5\' 5"  (1.651 m), weight 123 lb 10.9 oz (56.1 kg), SpO2 99 %.   Physical Exam Vitals and nursing note reviewed.  Constitutional:      General: She is not in acute distress.    Appearance: She is well-developed and well-nourished. She is not diaphoretic.     Comments: Thin woman sitting comfortably in the exam room.  She needs some  assistance onto the exam room table.  HENT:     Head: Normocephalic and atraumatic.     Mouth/Throat:     Mouth: Oropharynx is clear and moist.     Pharynx: No oropharyngeal exudate.      Comments: Shoulder length styled gray hair. Mask. Eyes:     General: No scleral icterus.    Extraocular Movements: EOM normal.     Conjunctiva/sclera: Conjunctivae normal.     Pupils: Pupils are equal, round, and reactive to light.     Comments: Glasses.  Blue eyes.  Cardiovascular:     Rate and Rhythm: Normal rate and regular rhythm.     Heart sounds: Normal heart sounds. No murmur heard.   Pulmonary:     Effort: Pulmonary effort is normal. No respiratory distress.     Breath sounds: Normal breath sounds. No wheezing or rales.  Chest:     Chest wall: No tenderness.  Abdominal:     General: Bowel sounds are normal. There is no distension.     Palpations: Abdomen is soft. There is no mass.     Tenderness: There is no abdominal tenderness. There is no guarding or rebound.  Musculoskeletal:         General: No tenderness or edema. Normal range of motion.     Cervical back: Normal range of motion and neck supple.     Comments: Kyphosis.  Lymphadenopathy:     Cervical: No cervical adenopathy.     Upper Body:  No axillary adenopathy present.    Right upper body: No supraclavicular adenopathy.     Left upper body: No supraclavicular adenopathy.  Skin:    General: Skin is warm and dry.  Neurological:     Mental Status: She is alert and oriented to person, place, and time.  Psychiatric:        Mood and Affect: Mood and affect normal.        Behavior: Behavior normal.        Thought Content: Thought content normal.        Judgment: Judgment normal.    No visits with results within 3 Day(s) from this visit.  Latest known visit with results is:  Orders Only on 06/16/2019  Component Date Value Ref Range Status  . Total Protein, Urine 06/16/2019 7.7  Not Estab. mg/dL Final  . Total Protein, Urine-Ur/day 06/16/2019 208* 30 - 150 mg/24 hr Final  . Albumin, U 06/16/2019 0.0  % Final   Comment: (NOTE) No urine protein electrophoretic pattern was observed after concentration. However, there is a detectable amount of protein by a quantitative spectrophotometric method.   . Alpha 1, Urine 06/16/2019 0.0  % Final  . Alpha 2, Urine 06/16/2019 0.0  % Final  . Beta, Urine 06/16/2019 0.0  % Final  . Gamma Globulin, Urine 06/16/2019 0.0  % Final  . M-spike, % 06/16/2019 Not Observed  Not Observed % Final  . Please Note: 06/16/2019 Comment   Final   Comment: (NOTE) Protein electrophoresis scan will follow via computer, mail, or courier delivery. Performed At: [facility] [place], [place] [id number] Rush Farmer MD [id number]   . Total Volume 06/16/2019 2,700   Final   Performed at [facility], [address] [place], [place] [address]  . Total Protein, Urine 06/16/2019 7.1  Not Estab. mg/dL Final  . Total Protein, Urine-Ur/day  06/16/2019 192* 30 - 150 mg/24 hr Final  . Albumin, U 06/16/2019 0.0  %  Final   Comment: (NOTE) No urine protein electrophoretic pattern was observed after concentration. However, there is a detectable amount of protein by a quantitative spectrophotometric method.   . ALPHA 1 URINE 06/16/2019 0.0  % Final  . Alpha 2, Urine 06/16/2019 0.0  % Final  . % BETA, Urine 06/16/2019 0.0  % Final  . GAMMA GLOBULIN URINE 06/16/2019 0.0  % Final  . Free Kappa Lt Chains,Ur 06/16/2019 11.88  0.63 - 113.79 mg/L Final  . Free Lambda Lt Chains,Ur 06/16/2019 <0.70  0.47 - 11.77 mg/L Corrected  . Free Kappa/Lambda Ratio 06/16/2019 >16.97  1.03 - 31.76 Corrected   Comment: (NOTE) Performed At: [facility] [place], [place] [id number] Rush Farmer MD [id number]   . Immunofixation Result, Urine 06/16/2019 Comment   Corrected   Comment: (NOTE) The immunofixation pattern appears unremarkable. Evidence of monoclonal protein is not apparent.   . Total Volume 06/16/2019 2,700   Final   Performed at [facility], [address] [place], [place] [address]  . M-SPIKE %, Urine 06/16/2019 Not Observed  Not Observed % Corrected  . Note: 06/16/2019 Comment   Corrected   Comment: (NOTE) Protein electrophoresis scan will follow via computer, mail, or courier delivery.     Assessment:  Katherine Shields is a 79 y.o. 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  She presented with right hemifacial spasms beginning in 03/2019.   Head MRI on 04/13/2019 showed generalized dural thickening which encroached on the internal auditory canals and could explain the history. A cause of the dural thickening was not seen.  There was no evidence of brain sagging, venous occlusion, or posttraumatic pattern. This smooth thickening was more suggestive of a reactive/inflammatory process than a neoplasm.  There was a small focus of nodular enhancement posterior to the  right V4 segment just beyond the dura, likely benign enhancing lesion of the foramen magnum.  Work-up on 04/20/2019 revealed a hematocrit 39.2, hemoglobin 13.0, MCV 97.3, platelets 220,000, and WBC 5500 with an Owen of 4200.  Calcium was 9.6.  Creatinine was 0.7.  Protein was 7.3 with an albumin of 4.2.  SPEP revealed a 0.6 gm/dL IgG monoclonal protein with kappa light chain specificity.  IgG was 1631 with an IgA of 188 and an IgM of 30.  Normal labs included: Vitamin D, 25-hydroxy, B12, ANA, RA, treponema antibodies, MPO antibodies, C-ANCA, P-ANCA, PT, PTT, lupus anticoagulant panel, beta2 glycoprotein antibodies, Lyme titer, HTLV I-II, Quantiferon gold, Sjogren's antibodies, ACE level, SARS-COVID antibody IgG, TSH, and sed rate.  Cervical spine MRI on 05/13/2019 revealed dural thickening and smooth enhancement extending into the proximal spinal canal to C3.  Findings are similar to the prior study. 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  Work up on 05/17/2019 included a hematocrit 38.5, hemoglobin 13.0, platelets 204,000, WBC 6,000.  Creatinine, calcium, and beta2-microglobulin were normal.  SPEP revealed a 0.6 gm/dL IgG monoclonal protein with kappa light chain specificity. Kappa free light chain was 43.58 with ratio of 3.17 (0.26-3.17).   24 hour UPEP on 06/16/2019 revealed no monoclonal protein.  She received the first COVID-19 vaccine on 06/13/2019 and will receive the second dose on 07/11/2019.  Symptomatically, she feels "ok".  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 Blood pressure is 193/99.  Exam is stable.   Plan: 1.  Review work up. 2.   IgG monoclonal gammopathy             He has a monoclonal gammopathy of unknown significance (MGUS).             She has no CRAB criteria- hypercalcemia, renal disease, anemia, bone lesions.             M-spike was 0.6  gm/dL IgG monoclonal protein with kappa light chain specificity.             Discuss risk of transformation to a lymphoplasmacytic disorder (myeloma) at 1 %/year.  Continue to monitor every 6 months. 3.   Enhancing lesion at skull base             Review interval note by Dr Izora Ribas.             She may have a schwannoma.  Anticipate follow-up imaging in 4-6 months. 4.   Severe hypertension             Blood pressure 193/99.  Recheck BP.  PCP office contacted.  If still elevated, patient to ER. 5.   RTC in 3 months for labs (CBC with diff, BMP, SPEP). 6.   RTC in 6 months for MD assessment, labs (CBC with diff, CMP, SPEP, FLCA).  I discussed the assessment and treatment plan with the patient.  The patient was provided an opportunity to ask questions and all were answered.  The patient agreed with the plan and demonstrated an understanding of the instructions.  The patient was advised to call back if the symptoms worsen or if the condition fails to improve as anticipated.  I provided 17 minutes of face-to-face time during this this encounter and > 50% was spent counseling as documented under my assessment and plan.  An additional 5-8 minutes were spent reviewing her chart (Epic and Care Everywhere) including notes, labs, and imaging studies.    Lequita Asal, MD, PhD    06/29/2019, 11:53 AM  I, Selena Batten, am acting as scribe for [employer]. Mike Gip, MD, PhD.  I, Zarah Carbon C. Mike Gip, MD, have reviewed the above documentation for accuracy and completeness, and I agree with the above.

## 2019-06-28 ENCOUNTER — Encounter: Payer: Self-pay | Admitting: Hematology and Oncology

## 2019-06-28 ENCOUNTER — Other Ambulatory Visit: Payer: Self-pay

## 2019-06-28 NOTE — Progress Notes (Signed)
No new changes noted today. The patient name and DOB has been verified by phone today. 

## 2019-06-29 ENCOUNTER — Encounter: Payer: Self-pay | Admitting: Hematology and Oncology

## 2019-06-29 ENCOUNTER — Telehealth: Payer: Self-pay

## 2019-06-29 ENCOUNTER — Inpatient Hospital Stay: Payer: Medicare Other | Attending: Hematology and Oncology | Admitting: Hematology and Oncology

## 2019-06-29 ENCOUNTER — Other Ambulatory Visit: Payer: Self-pay

## 2019-06-29 ENCOUNTER — Emergency Department
Admission: EM | Admit: 2019-06-29 | Discharge: 2019-06-29 | Disposition: A | Discharge status: Home / Self Care | Payer: Medicare Other | Attending: Emergency Medicine | Admitting: Emergency Medicine

## 2019-06-29 VITALS — BP 193/99 | HR 93 | Temp 98.2°F | Resp 18 | Ht 65.0 in | Wt 123.7 lb

## 2019-06-29 DIAGNOSIS — G96810 Intracranial hypotension, unspecified: Secondary | ICD-10-CM | POA: Diagnosis not present

## 2019-06-29 DIAGNOSIS — M47812 Spondylosis without myelopathy or radiculopathy, cervical region: Secondary | ICD-10-CM | POA: Diagnosis not present

## 2019-06-29 DIAGNOSIS — Z79899 Other long term (current) drug therapy: Secondary | ICD-10-CM | POA: Diagnosis not present

## 2019-06-29 DIAGNOSIS — Z7982 Long term (current) use of aspirin: Secondary | ICD-10-CM | POA: Insufficient documentation

## 2019-06-29 DIAGNOSIS — R0989 Other specified symptoms and signs involving the circulatory and respiratory systems: Secondary | ICD-10-CM | POA: Insufficient documentation

## 2019-06-29 DIAGNOSIS — Z87891 Personal history of nicotine dependence: Secondary | ICD-10-CM | POA: Insufficient documentation

## 2019-06-29 DIAGNOSIS — I10 Essential (primary) hypertension: Secondary | ICD-10-CM | POA: Diagnosis not present

## 2019-06-29 DIAGNOSIS — D472 Monoclonal gammopathy: Secondary | ICD-10-CM | POA: Diagnosis present

## 2019-06-29 DIAGNOSIS — Z8249 Family history of ischemic heart disease and other diseases of the circulatory system: Secondary | ICD-10-CM | POA: Diagnosis not present

## 2019-06-29 LAB — BASIC METABOLIC PANEL
Anion gap: 10 (ref 5–15)
BUN: 9 mg/dL (ref 8–23)
CO2: 30 mmol/L (ref 22–32)
Calcium: 9.3 mg/dL (ref 8.9–10.3)
Chloride: 102 mmol/L (ref 98–111)
Creatinine, Ser: 0.69 mg/dL (ref 0.44–1.00)
GFR calc Af Amer: >60 mL/min (ref >60)
GFR calc non Af Amer: >60 mL/min (ref >60)
Glucose, Bld: 91 mg/dL (ref 70–99)
Potassium: 4.2 mmol/L (ref 3.5–5.1)
Sodium: 142 mmol/L (ref 135–145)

## 2019-06-29 LAB — CBC
HCT: 40.3 % (ref 36.0–46.0)
Hemoglobin: 13.4 g/dL (ref 12.0–15.0)
MCH: 32.2 pg (ref 26.0–34.0)
MCHC: 33.3 g/dL (ref 30.0–36.0)
MCV: 96.9 fL (ref 80.0–100.0)
Platelets: 198 10*3/uL (ref 150–400)
RBC: 4.16 MIL/uL (ref 3.87–5.11)
RDW: 12.8 % (ref 11.5–15.5)
WBC: 5.2 10*3/uL (ref 4.0–10.5)
nRBC: 0 % (ref 0.0–0.2)

## 2019-06-29 NOTE — ED Triage Notes (Signed)
Pt states she was sent from her PCP for HTN, 193/99, states she is on daily b/p meds . States she has had an intermittent HA but no other sx. States she was there today for routine labs.

## 2019-06-29 NOTE — ED Notes (Signed)
This Rn spoke with Patriciaann Clan, pt's ride home.

## 2019-06-29 NOTE — ED Notes (Signed)
Pt sitting on bench NAD noted upon reassessment. Pt denies H/A vision changes at this time.

## 2019-06-29 NOTE — Telephone Encounter (Signed)
Spoke with the patient PCP office Toni Arthurs) to inform them of the patient blood pressure today in office 194/123. Dr Mike Gip would like to know what they would like to do with this patient. Spoke with the Nurse Amber at PCP office and they state to send her to the ED for further care due to her blood pressure.Dr Mike Gip has been made aware and the patient.

## 2019-06-29 NOTE — ED Provider Notes (Signed)
[facility] Emergency Department Provider Note _______   First MD Initiated Contact with Patient 06/29/19 1450     (approximate)  I have reviewed the triage vital signs and the nursing notes.   HISTORY  Chief Complaint Hypertension    HPI Katherine Shields is a 79 y.o. female with below list of previous medical conditions including hypertension presents to the emergency department secondary to elevated blood pressure noted in clinic today.  Patient states that she presented to cardiology clinic [place] for 24-hour urine analysis and was noted to have an elevated blood pressure there of 193/99.  Patient is currently taking losartan which she states that she is compliant with.  Patient denies any symptoms at present.  Patient does state that her chronic right facial involuntary movements are consistent with previous.  Patient denies any chest pain no shortness of breath no headache nausea or vomiting.  Patient denies any weakness numbness visual changes or gait instability.        Past Medical History:  Diagnosis Date  . Hyperlipidemia   . Hypertension   . Thyroid disease     Patient Active Problem List   Diagnosis Date Noted  . IgG monoclonal gammopathy 05/17/2019    History reviewed. No pertinent surgical history.  Prior to Admission medications   Medication Sig Start Date End Date Taking? Authorizing Provider  aspirin 81 MG tablet Take 81 mg by mouth daily.    [provider]  atenolol (TENORMIN) 25 MG tablet Take 25 mg by mouth daily.    [provider]  baclofen (LIORESAL) 10 MG tablet Take 1 tablet by mouth as needed. 04/20/19 04/19/20  [provider]  levothyroxine (SYNTHROID, LEVOTHROID) 25 MCG tablet Take 25 mcg by mouth daily before breakfast.    [provider]  lisinopril (PRINIVIL,ZESTRIL) 10 MG tablet Take 10 mg by mouth daily.    [provider]  loratadine (CLARITIN) 10 MG tablet Take 10 mg by  mouth daily.    [provider]  losartan (COZAAR) 100 MG tablet Take 100 mg by mouth daily.  03/24/19   [provider]  lovastatin (MEVACOR) 10 MG tablet Take 10 mg by mouth at bedtime.    [provider]  Multiple Vitamins-Minerals (MULTIVITAMIN GUMMIES ADULT PO) Take 1 tablet by mouth daily.    [provider]  Multiple Vitamins-Minerals (PRESERVISION AREDS 2 PO) Take 1 tablet by mouth daily.     [provider]  oxybutynin (DITROPAN-XL) 5 MG 24 hr tablet Take 5 mg by mouth at bedtime.    [provider]  raloxifene (EVISTA) 60 MG tablet Take 60 mg by mouth daily.  03/24/19   [provider]    Allergies Alendronate, Risedronate, and Simvastatin  Family History  Problem Relation Age of Onset  . Hypertension Father   . Breast cancer Neg Hx     Social History Social History   Tobacco Use  . Smoking status: Former Smoker    Quit date: 1980    Years since quitting: 41.2  . Smokeless tobacco: Never Used  Substance Use Topics  . Alcohol use: No  . Drug use: No    Review of Systems Constitutional: No fever/chills Eyes: No visual changes. ENT: No sore throat. Cardiovascular: Denies chest pain. Respiratory: Denies shortness of breath. Gastrointestinal: No abdominal pain.  No nausea, no vomiting.  No diarrhea.  No constipation. Genitourinary: Negative for dysuria. Musculoskeletal: Negative for neck pain.  Negative for back pain. Integumentary:  Negative for rash. Neurological: Negative for headaches, focal weakness or numbness.  ____________________________________________   PHYSICAL EXAM:  VITAL SIGNS: ED Triage Vitals  Enc Vitals Group     BP 06/29/19 1328 (!) 215/94     Pulse Rate 06/29/19 1327 79     Resp 06/29/19 1327 17     Temp 06/29/19 1331 98.5 F (36.9 C)     Temp Source 06/29/19 1327 Oral     SpO2 06/29/19 1327 98 %     Weight 06/29/19 1328 55.8 kg (123 lb)     Height 06/29/19 1328 1.651 m (5'  5")     Head Circumference --      Peak Flow --      Pain Score 06/29/19 1328 0     Pain Loc --      Pain Edu? --      Excl. in [place]? --     Constitutional: Alert and oriented.  Eyes: Conjunctivae are normal.  Mouth/Throat: Patient is wearing a mask. Neck: No stridor.  No meningeal signs.   Cardiovascular: Normal rate, regular rhythm. Good peripheral circulation. Grossly normal heart sounds. Respiratory: Normal respiratory effort.  No retractions. Gastrointestinal: Soft and nontender. No distention.  Musculoskeletal: No lower extremity tenderness nor edema. No gross deformities of extremities. Neurologic:  Normal speech and language. No gross focal neurologic deficits are appreciated.  Skin:  Skin is warm, dry and intact. Psychiatric: Mood and affect are normal. Speech and behavior are normal.  ____________________________________________   LABS (all labs ordered are listed, but only abnormal results are displayed)  Labs Reviewed  BASIC METABOLIC PANEL  CBC   __________ Procedures   ____________________________________________   INITIAL IMPRESSION / MDM / [place] / ED COURSE  As part of my medical decision making, I reviewed the following data within the electronic MEDICAL RECORD NUMBER   79 year old female presented with above-stated history and physical exam secondary to asymptomatic hypertension.  Patient blood pressure currently 147/98 without any intervention.  Laboratory data unremarkable.  I called her noted clinic at 3:00 PM to speak with the patient's provider Payton Mccallum NP) and I am currently awaiting a call back..  I spoke with the patient's primary care provider and informed her of all clinical findings and the fact that the patient remains asymptomatic and blood pressure is improved without intervention.  Patient will follow up with primary care provider outpatient.  ____________________________________________  FINAL CLINICAL IMPRESSION(S) / ED  DIAGNOSES  Final diagnoses:  Hypertension, unspecified type     MEDICATIONS GIVEN DURING THIS VISIT:  Medications - No data to display   ED Discharge Orders    None      *Please note:  Katherine Shields was evaluated in Emergency Department on 06/29/2019 for the symptoms described in the history of present illness. She was evaluated in the context of the global COVID-19 pandemic, which necessitated consideration that the patient might be at risk for infection with the SARS-CoV-2 virus that causes COVID-19. Institutional protocols and algorithms that pertain to the evaluation of patients at risk for COVID-19 are in a state of rapid change based on information released by regulatory bodies including the CDC and federal and state organizations. These policies and algorithms were followed during the patient's care in the ED.  Some ED evaluations and interventions may be delayed as a result of limited staffing during the pandemic.*  Note:  This document was prepared using Dragon voice recognition software and may include unintentional dictation errors.  Gregor Hams, MD 06/29/19 2146

## 2019-06-29 NOTE — ED Notes (Signed)
See triage note, pt states she was sent to ED from cancer center for HTN. Pt states she takes BP medications every night. Denies headache or impaired vision at this time. Cancer center documents show BP 193/99

## 2019-09-29 ENCOUNTER — Inpatient Hospital Stay: Payer: Medicare Other | Attending: Hematology and Oncology

## 2019-10-26 ENCOUNTER — Other Ambulatory Visit: Payer: Self-pay | Admitting: Neurosurgery

## 2019-10-26 DIAGNOSIS — G939 Disorder of brain, unspecified: Secondary | ICD-10-CM

## 2019-11-16 ENCOUNTER — Ambulatory Visit
Admission: RE | Admit: 2019-11-16 | Discharge: 2019-11-16 | Disposition: A | Discharge status: Home / Self Care | Payer: Medicare Other | Source: Ambulatory Visit | Attending: Neurosurgery | Admitting: Neurosurgery

## 2019-11-16 ENCOUNTER — Other Ambulatory Visit: Payer: Self-pay

## 2019-11-16 DIAGNOSIS — G939 Disorder of brain, unspecified: Secondary | ICD-10-CM | POA: Diagnosis not present

## 2019-11-16 MED ORDER — GADOBUTROL 1 MMOL/ML IV SOLN
5.0000 mL | Freq: Once | INTRAVENOUS | Status: AC | PRN
  Administered 2019-11-16: 5 mL via INTRAVENOUS

## 2019-12-30 ENCOUNTER — Ambulatory Visit: Payer: Medicare Other | Admitting: Hematology and Oncology

## 2019-12-30 ENCOUNTER — Other Ambulatory Visit: Payer: Medicare Other

## 2020-05-04 ENCOUNTER — Other Ambulatory Visit: Payer: Medicare Other | Attending: Ophthalmology

## 2020-05-10 ENCOUNTER — Encounter: Payer: Self-pay | Admitting: Ophthalmology

## 2020-05-10 ENCOUNTER — Other Ambulatory Visit: Payer: Self-pay

## 2020-05-18 ENCOUNTER — Other Ambulatory Visit: Payer: Self-pay

## 2020-05-18 ENCOUNTER — Other Ambulatory Visit
Admission: RE | Admit: 2020-05-18 | Discharge: 2020-05-18 | Disposition: A | Discharge status: Home / Self Care | Payer: Medicare Other | Source: Ambulatory Visit | Attending: Ophthalmology | Admitting: Ophthalmology

## 2020-05-18 DIAGNOSIS — Z20822 Contact with and (suspected) exposure to covid-19: Secondary | ICD-10-CM | POA: Insufficient documentation

## 2020-05-18 DIAGNOSIS — Z01812 Encounter for preprocedural laboratory examination: Secondary | ICD-10-CM | POA: Diagnosis present

## 2020-05-18 NOTE — Discharge Instructions (Signed)

## 2020-05-19 LAB — SARS CORONAVIRUS 2 (TAT 6-24 HRS): SARS Coronavirus 2: NEGATIVE

## 2020-05-22 ENCOUNTER — Ambulatory Visit: Payer: Medicare Other | Admitting: Anesthesiology

## 2020-05-22 ENCOUNTER — Ambulatory Visit
Admission: RE | Admit: 2020-05-22 | Discharge: 2020-05-22 | Disposition: A | Discharge status: Home / Self Care | Payer: Medicare Other | Attending: Ophthalmology | Admitting: Ophthalmology

## 2020-05-22 ENCOUNTER — Encounter
Admission: RE | Disposition: A | Discharge status: Home / Self Care | Payer: Self-pay | Source: Home / Self Care | Attending: Ophthalmology

## 2020-05-22 ENCOUNTER — Encounter: Payer: Self-pay | Admitting: Ophthalmology

## 2020-05-22 ENCOUNTER — Other Ambulatory Visit: Payer: Self-pay

## 2020-05-22 DIAGNOSIS — Z8249 Family history of ischemic heart disease and other diseases of the circulatory system: Secondary | ICD-10-CM | POA: Diagnosis not present

## 2020-05-22 DIAGNOSIS — Z79899 Other long term (current) drug therapy: Secondary | ICD-10-CM | POA: Insufficient documentation

## 2020-05-22 DIAGNOSIS — Z87891 Personal history of nicotine dependence: Secondary | ICD-10-CM | POA: Insufficient documentation

## 2020-05-22 DIAGNOSIS — Z7982 Long term (current) use of aspirin: Secondary | ICD-10-CM | POA: Diagnosis not present

## 2020-05-22 DIAGNOSIS — H2512 Age-related nuclear cataract, left eye: Secondary | ICD-10-CM | POA: Insufficient documentation

## 2020-05-22 DIAGNOSIS — Z888 Allergy status to other drugs, medicaments and biological substances status: Secondary | ICD-10-CM | POA: Diagnosis not present

## 2020-05-22 HISTORY — PX: CATARACT EXTRACTION W/PHACO: SHX586

## 2020-05-22 SURGERY — PHACOEMULSIFICATION, CATARACT, WITH IOL INSERTION
Anesthesia: Monitor Anesthesia Care | Site: Eye | Laterality: Left

## 2020-05-22 MED ORDER — EPINEPHRINE PF 1 MG/ML IJ SOLN
INTRAOCULAR | Status: DC | PRN
  Administered 2020-05-22: 89 mL via OPHTHALMIC

## 2020-05-22 MED ORDER — MOXIFLOXACIN HCL 0.5 % OP SOLN
OPHTHALMIC | Status: DC | PRN
  Administered 2020-05-22: 0.2 mL via OPHTHALMIC

## 2020-05-22 MED ORDER — ACETAMINOPHEN 325 MG PO TABS: 325.0000 mg | ORAL_TABLET | Freq: Once | ORAL | Status: DC

## 2020-05-22 MED ORDER — FENTANYL CITRATE (PF) 100 MCG/2ML IJ SOLN
INTRAMUSCULAR | Status: DC | PRN
  Administered 2020-05-22: 50 ug via INTRAVENOUS

## 2020-05-22 MED ORDER — TETRACAINE HCL 0.5 % OP SOLN
1.0000 [drp] | OPHTHALMIC | Status: DC | PRN
  Administered 2020-05-22 (×3): 1 [drp] via OPHTHALMIC

## 2020-05-22 MED ORDER — MIDAZOLAM HCL 2 MG/2ML IJ SOLN
INTRAMUSCULAR | Status: DC | PRN
  Administered 2020-05-22: .5 mg via INTRAVENOUS

## 2020-05-22 MED ORDER — SODIUM HYALURONATE 10 MG/ML IO SOLN
INTRAOCULAR | Status: DC | PRN
  Administered 2020-05-22: 0.55 mL via INTRAOCULAR

## 2020-05-22 MED ORDER — LIDOCAINE HCL (PF) 2 % IJ SOLN
INTRAOCULAR | Status: DC | PRN
  Administered 2020-05-22: 1 mL via INTRAOCULAR

## 2020-05-22 MED ORDER — ACETAMINOPHEN 160 MG/5ML PO SOLN: 325.0000 mg | Freq: Once | ORAL | Status: DC

## 2020-05-22 MED ORDER — ARMC OPHTHALMIC DILATING DROPS
1.0000 "application " | OPHTHALMIC | Status: DC | PRN
  Administered 2020-05-22 (×3): 1 via OPHTHALMIC

## 2020-05-22 MED ORDER — LACTATED RINGERS IV SOLN: INTRAVENOUS | Status: DC

## 2020-05-22 MED ORDER — SODIUM HYALURONATE 23 MG/ML IO SOLN
INTRAOCULAR | Status: DC | PRN
  Administered 2020-05-22: 0.6 mL via INTRAOCULAR

## 2020-05-22 SURGICAL SUPPLY — 19 items
CANNULA ANT/CHMB 27G (MISCELLANEOUS) ×2 IMPLANT
CANNULA ANT/CHMB 27GA (MISCELLANEOUS) ×4 IMPLANT
DISSECTOR HYDRO NUCLEUS 50X22 (MISCELLANEOUS) ×2 IMPLANT
GLOVE SURG LX 7.5 STRW (GLOVE) ×1
GLOVE SURG LX STRL 7.5 STRW (GLOVE) ×1 IMPLANT
GLOVE SURG SYN 8.5  E (GLOVE) ×1
GLOVE SURG SYN 8.5 E (GLOVE) ×1 IMPLANT
GLOVE SURG SYN 8.5 PF PI (GLOVE) ×1 IMPLANT
GOWN STRL REUS W/ TWL LRG LVL3 (GOWN DISPOSABLE) ×2 IMPLANT
GOWN STRL REUS W/TWL LRG LVL3 (GOWN DISPOSABLE) ×4
LENS IOL TECNIS EYHANCE 23.5 (Intraocular Lens) ×1 IMPLANT
MARKER SKIN DUAL TIP RULER LAB (MISCELLANEOUS) ×2 IMPLANT
PACK DR. KING ARMS (PACKS) ×2 IMPLANT
PACK EYE AFTER SURG (MISCELLANEOUS) ×2 IMPLANT
PACK OPTHALMIC (MISCELLANEOUS) ×2 IMPLANT
SYR 3ML LL SCALE MARK (SYRINGE) ×2 IMPLANT
SYR TB 1ML LUER SLIP (SYRINGE) ×2 IMPLANT
WATER STERILE IRR 250ML POUR (IV SOLUTION) ×2 IMPLANT
WIPE NON LINTING 3.25X3.25 (MISCELLANEOUS) ×2 IMPLANT

## 2020-05-22 NOTE — Anesthesia Preprocedure Evaluation (Signed)
Anesthesia Evaluation  Patient identified by MRN, date of birth, ID band Patient awake    Reviewed: Allergy & Precautions, H&P , NPO status , Patient's Chart, lab work & pertinent test results  Airway Mallampati: II  TM Distance: >3 FB Neck ROM: full    Dental  (+) Missing, Loose   Pulmonary former smoker,    Pulmonary exam normal breath sounds clear to auscultation       Cardiovascular hypertension, Normal cardiovascular exam Rhythm:regular Rate:Normal     Neuro/Psych    GI/Hepatic   Endo/Other    Renal/GU      Musculoskeletal   Abdominal   Peds  Hematology   Anesthesia Other Findings   Reproductive/Obstetrics                             Anesthesia Physical  Anesthesia Plan  ASA: II  Anesthesia Plan: MAC   Post-op Pain Management:    Induction:   PONV Risk Score and Plan: 2 and Treatment may vary due to age or medical condition, Midazolam and TIVA  Airway Management Planned:   Additional Equipment:   Intra-op Plan:   Post-operative Plan:   Informed Consent: I have reviewed the patients History and Physical, chart, labs and discussed the procedure including the risks, benefits and alternatives for the proposed anesthesia with the patient or authorized representative who has indicated his/her understanding and acceptance.     Dental Advisory Given  Plan Discussed with: CRNA  Anesthesia Plan Comments:         Anesthesia Quick Evaluation  

## 2020-05-22 NOTE — Op Note (Signed)
OPERATIVE NOTE  Katherine Shields [id number] 05/22/2020   PREOPERATIVE DIAGNOSIS:  Nuclear sclerotic cataract left eye.  H25.12   POSTOPERATIVE DIAGNOSIS:    Nuclear sclerotic cataract left eye.     PROCEDURE:  Phacoemusification with posterior chamber intraocular lens placement of the left eye   LENS:   Implant Name Type Inv. Item Serial No. Manufacturer Lot No. LRB No. Used Action  LENS IOL TECNIS EYHANCE 23.5 - [id number] Intraocular Lens LENS IOL TECNIS EYHANCE 23.5 [id number] JOHNSON   Left 1 Implanted      Procedure(s): CATARACT EXTRACTION PHACO AND INTRAOCULAR LENS PLACEMENT (IOC) LEFT 8.34 00:52.0 (Left)  DIB00 +23.5   ULTRASOUND TIME: 0 minutes 52 seconds.  CDE 8.34   SURGEON:  Benay Pillow, MD, MPH   ANESTHESIA:  Topical with tetracaine drops augmented with 1% preservative-free intracameral lidocaine.  ESTIMATED BLOOD LOSS: <1 mL   COMPLICATIONS:  None.   DESCRIPTION OF PROCEDURE:  The patient was identified in the holding room and transported to the operating room and placed in the supine position under the operating microscope.  The left eye was identified as the operative eye and it was prepped and draped in the usual sterile ophthalmic fashion.   A 1.0 millimeter clear-corneal paracentesis was made at the 5:00 position. 0.5 ml of preservative-free 1% lidocaine with epinephrine was injected into the anterior chamber.  The anterior chamber was filled with Healon 5 viscoelastic.  A 2.4 millimeter keratome was used to make a near-clear corneal incision at the 2:00 position.  A curvilinear capsulorrhexis was made with a cystotome and capsulorrhexis forceps.  Balanced salt solution was used to hydrodissect and hydrodelineate the nucleus.   Phacoemulsification was then used in stop and chop fashion to remove the lens nucleus and epinucleus.  The remaining cortex was then removed using the irrigation and aspiration handpiece. Healon was then placed into the capsular bag to  distend it for lens placement.  A lens was then injected into the capsular bag.  The remaining viscoelastic was aspirated.   Wounds were hydrated with balanced salt solution.  The anterior chamber was inflated to a physiologic pressure with balanced salt solution.  Intracameral vigamox 0.1 mL undiltued was injected into the eye and a drop placed onto the ocular surface.  No wound leaks were noted.  The patient was taken to the recovery room in stable condition without complications of anesthesia or surgery  Benay Pillow 05/22/2020, 11:12 AM

## 2020-05-22 NOTE — Progress Notes (Signed)
No risk

## 2020-05-22 NOTE — Anesthesia Postprocedure Evaluation (Signed)
Anesthesia Post Note  Patient: Katherine Shields  Procedure(s) Performed: CATARACT EXTRACTION PHACO AND INTRAOCULAR LENS PLACEMENT (IOC) LEFT 8.34 00:52.0 (Left Eye)     Patient location during evaluation: PACU Anesthesia Type: MAC Level of consciousness: awake and alert and oriented Pain management: satisfactory to patient Vital Signs Assessment: post-procedure vital signs reviewed and stable Respiratory status: spontaneous breathing, nonlabored ventilation and respiratory function stable Cardiovascular status: blood pressure returned to baseline and stable Postop Assessment: Adequate PO intake and No signs of nausea or vomiting Anesthetic complications: no   No complications documented.  Raliegh Ip

## 2020-05-22 NOTE — Anesthesia Procedure Notes (Signed)
Procedure Name: MAC Date/Time: 05/22/2020 10:54 AM Performed by: Silvana Newness, CRNA Pre-anesthesia Checklist: Patient identified, Emergency Drugs available, Suction available, Patient being monitored and Timeout performed Patient Re-evaluated:Patient Re-evaluated prior to induction Oxygen Delivery Method: Nasal cannula Placement Confirmation: positive ETCO2

## 2020-05-22 NOTE — Transfer of Care (Addendum)
Immediate Anesthesia Transfer of Care Note  Patient: Katherine Shields  Procedure(s) Performed: CATARACT EXTRACTION PHACO AND INTRAOCULAR LENS PLACEMENT (IOC) LEFT 8.34 00:52.0 (Left Eye)  Patient Location: PACU  Anesthesia Type: MAC  Level of Consciousness: awake, alert  and patient cooperative  Airway and Oxygen Therapy: Patient Spontanous Breathing and Patient connected to supplemental oxygen  Post-op Assessment: Post-op Vital signs reviewed, Patient's Cardiovascular Status Stable, Respiratory Function Stable, Patent Airway and No signs of Nausea or vomiting  Post-op Vital Signs: Reviewed and stable  Complications: No complications documented.

## 2020-05-22 NOTE — H&P (Signed)
[facility]   Primary Care Physician:  Toni Arthurs, NP Ophthalmologist: Dr. Benay Pillow  Pre-Procedure History & Physical: HPI:  Katherine Shields is a 80 y.o. female here for cataract surgery.   Past Medical History:  Diagnosis Date  . Hyperlipidemia   . Hypertension   . Thyroid disease     Past Surgical History:  Procedure Laterality Date  . COLONOSCOPY      Prior to Admission medications   Medication Sig Start Date End Date Taking? Authorizing Provider  AMLODIPINE BENZOATE PO Take by mouth daily.   Yes [provider]  aspirin 81 MG tablet Take 81 mg by mouth daily.   Yes [provider]  levothyroxine (SYNTHROID, LEVOTHROID) 25 MCG tablet Take 25 mcg by mouth daily before breakfast.   Yes [provider]  loratadine (CLARITIN) 10 MG tablet Take 10 mg by mouth daily.   Yes [provider]  losartan (COZAAR) 100 MG tablet Take 100 mg by mouth daily.  03/24/19  Yes [provider]  lovastatin (MEVACOR) 10 MG tablet Take 10 mg by mouth at bedtime.   Yes [provider]  Multiple Vitamins-Minerals (MULTIVITAMIN GUMMIES ADULT PO) Take 1 tablet by mouth daily.   Yes [provider]  Multiple Vitamins-Minerals (PRESERVISION AREDS 2 PO) Take 1 tablet by mouth daily.    Yes [provider]  oxybutynin (DITROPAN-XL) 5 MG 24 hr tablet Take 5 mg by mouth at bedtime.   Yes [provider]  raloxifene (EVISTA) 60 MG tablet Take 60 mg by mouth daily.  03/24/19  Yes [provider]    Allergies as of 03/14/2020 - Review Complete 06/29/2019  Allergen Reaction Noted  . Alendronate  07/30/2013  . Risedronate  07/30/2013  . Simvastatin  07/30/2013    Family History  Problem Relation Age of Onset  . Hypertension Father   . Breast cancer Neg Hx     Social History   Socioeconomic History  . Marital status: Widowed    Spouse name: Not on file  . Number of children: Not on file  . Years of  education: Not on file  . Highest education level: Not on file  Occupational History  . Not on file  Tobacco Use  . Smoking status: Former Smoker    Quit date: 1980    Years since quitting: 42.1  . Smokeless tobacco: Never Used  Vaping Use  . Vaping Use: Never used  Substance and Sexual Activity  . Alcohol use: No  . Drug use: No  . Sexual activity: Not on file  Other Topics Concern  . Not on file  Social History Narrative  . Not on file   Social Determinants of Health   Financial Resource Strain: Not on file  Food Insecurity: Not on file  Transportation Needs: Not on file  Physical Activity: Not on file  Stress: Not on file  Social Connections: Not on file  Intimate Partner Violence: Not on file    Review of Systems: See HPI, otherwise negative ROS  Physical Exam: BP (!) 168/85   Pulse 68   Temp 97.6 F (36.4 C) (Temporal)   Resp 18   Ht 5\' 6"  (1.676 m)   Wt 55.9 kg   SpO2 99%   BMI 19.90 kg/m  General:   Alert,  pleasant and cooperative in NAD Head:  Normocephalic and atraumatic. Respiratory:  Normal work of breathing.  Impression/Plan: Katherine Shields is here for cataract surgery.  Risks, benefits,  limitations, and alternatives regarding cataract surgery have been reviewed with the patient.  Questions have been answered.  All parties agreeable.   Benay Pillow, MD  05/22/2020, 10:35 AM

## 2020-05-23 ENCOUNTER — Encounter: Payer: Self-pay | Admitting: Ophthalmology

## 2020-05-29 ENCOUNTER — Encounter: Payer: Self-pay | Admitting: Ophthalmology

## 2020-05-29 ENCOUNTER — Other Ambulatory Visit: Payer: Self-pay

## 2020-05-31 NOTE — Discharge Instructions (Signed)

## 2020-06-01 ENCOUNTER — Other Ambulatory Visit
Admission: RE | Admit: 2020-06-01 | Discharge: 2020-06-01 | Disposition: A | Discharge status: Home / Self Care | Payer: Medicare Other | Source: Ambulatory Visit | Attending: Ophthalmology | Admitting: Ophthalmology

## 2020-06-01 DIAGNOSIS — Z20822 Contact with and (suspected) exposure to covid-19: Secondary | ICD-10-CM | POA: Diagnosis not present

## 2020-06-01 DIAGNOSIS — Z01812 Encounter for preprocedural laboratory examination: Secondary | ICD-10-CM | POA: Diagnosis present

## 2020-06-01 LAB — SARS CORONAVIRUS 2 (TAT 6-24 HRS): SARS Coronavirus 2: NEGATIVE

## 2020-06-05 ENCOUNTER — Ambulatory Visit: Payer: Medicare Other | Admitting: Anesthesiology

## 2020-06-05 ENCOUNTER — Encounter: Payer: Self-pay | Admitting: Ophthalmology

## 2020-06-05 ENCOUNTER — Ambulatory Visit
Admission: RE | Admit: 2020-06-05 | Discharge: 2020-06-05 | Disposition: A | Discharge status: Home / Self Care | Payer: Medicare Other | Attending: Ophthalmology | Admitting: Ophthalmology

## 2020-06-05 ENCOUNTER — Encounter
Admission: RE | Disposition: A | Discharge status: Home / Self Care | Payer: Self-pay | Source: Home / Self Care | Attending: Ophthalmology

## 2020-06-05 DIAGNOSIS — Z7982 Long term (current) use of aspirin: Secondary | ICD-10-CM | POA: Insufficient documentation

## 2020-06-05 DIAGNOSIS — Z888 Allergy status to other drugs, medicaments and biological substances status: Secondary | ICD-10-CM | POA: Diagnosis not present

## 2020-06-05 DIAGNOSIS — Z79890 Hormone replacement therapy (postmenopausal): Secondary | ICD-10-CM | POA: Insufficient documentation

## 2020-06-05 DIAGNOSIS — Z87891 Personal history of nicotine dependence: Secondary | ICD-10-CM | POA: Insufficient documentation

## 2020-06-05 DIAGNOSIS — Z79899 Other long term (current) drug therapy: Secondary | ICD-10-CM | POA: Diagnosis not present

## 2020-06-05 DIAGNOSIS — H2511 Age-related nuclear cataract, right eye: Secondary | ICD-10-CM | POA: Diagnosis present

## 2020-06-05 HISTORY — PX: CATARACT EXTRACTION W/PHACO: SHX586

## 2020-06-05 SURGERY — PHACOEMULSIFICATION, CATARACT, WITH IOL INSERTION
Anesthesia: Monitor Anesthesia Care | Site: Eye | Laterality: Right

## 2020-06-05 MED ORDER — EPINEPHRINE PF 1 MG/ML IJ SOLN
INTRAOCULAR | Status: DC | PRN
  Administered 2020-06-05: 76 mL via OPHTHALMIC

## 2020-06-05 MED ORDER — ACETAMINOPHEN 325 MG PO TABS: 325.0000 mg | ORAL_TABLET | Freq: Once | ORAL | Status: DC

## 2020-06-05 MED ORDER — SODIUM HYALURONATE 10 MG/ML IO SOLN
INTRAOCULAR | Status: DC | PRN
  Administered 2020-06-05: 0.55 mL via INTRAOCULAR

## 2020-06-05 MED ORDER — MOXIFLOXACIN HCL 0.5 % OP SOLN
OPHTHALMIC | Status: DC | PRN
  Administered 2020-06-05: 0.2 mL via OPHTHALMIC

## 2020-06-05 MED ORDER — ARMC OPHTHALMIC DILATING DROPS
1.0000 "application " | OPHTHALMIC | Status: DC | PRN
  Administered 2020-06-05 (×3): 1 via OPHTHALMIC

## 2020-06-05 MED ORDER — MIDAZOLAM HCL 2 MG/2ML IJ SOLN
INTRAMUSCULAR | Status: DC | PRN
  Administered 2020-06-05: 1 mg via INTRAVENOUS

## 2020-06-05 MED ORDER — SODIUM HYALURONATE 23 MG/ML IO SOLN
INTRAOCULAR | Status: DC | PRN
  Administered 2020-06-05: 0.6 mL via INTRAOCULAR

## 2020-06-05 MED ORDER — FENTANYL CITRATE (PF) 100 MCG/2ML IJ SOLN
INTRAMUSCULAR | Status: DC | PRN
  Administered 2020-06-05: 50 ug via INTRAVENOUS

## 2020-06-05 MED ORDER — LIDOCAINE HCL (PF) 2 % IJ SOLN
INTRAOCULAR | Status: DC | PRN
  Administered 2020-06-05: 1 mL via INTRAOCULAR

## 2020-06-05 MED ORDER — LACTATED RINGERS IV SOLN: INTRAVENOUS | Status: DC

## 2020-06-05 MED ORDER — TETRACAINE HCL 0.5 % OP SOLN
1.0000 [drp] | OPHTHALMIC | Status: DC | PRN
  Administered 2020-06-05 (×3): 1 [drp] via OPHTHALMIC

## 2020-06-05 MED ORDER — ACETAMINOPHEN 160 MG/5ML PO SOLN: 325.0000 mg | Freq: Once | ORAL | Status: DC

## 2020-06-05 SURGICAL SUPPLY — 19 items

## 2020-06-05 NOTE — Op Note (Signed)
OPERATIVE NOTE  TAMMATHA COBB [id number] 06/05/2020   PREOPERATIVE DIAGNOSIS:  Nuclear sclerotic cataract right eye.  H25.11   POSTOPERATIVE DIAGNOSIS:    Nuclear sclerotic cataract right eye.     PROCEDURE:  Phacoemusification with posterior chamber intraocular lens placement of the right eye   LENS:   Implant Name Type Inv. Item Serial No. Manufacturer Lot No. LRB No. Used Action  LENS IOL TECNIS EYHANCE 23.0 - [id number] Intraocular Lens LENS IOL TECNIS EYHANCE 23.0 [id number] JOHNSON   Right 1 Implanted       Procedure(s) with comments: CATARACT EXTRACTION PHACO AND INTRAOCULAR LENS PLACEMENT (IOC) RIGHT (Right) - 7.92 0:45.4  DIB00 +23.0   ULTRASOUND TIME: 0 minutes 45 seconds.  CDE 7.92   SURGEON:  Benay Pillow, MD, MPH  ANESTHESIOLOGIST: Anesthesiologist: Ronelle Nigh, MD CRNA: Cameron Ali, CRNA   ANESTHESIA:  Topical with tetracaine drops augmented with 1% preservative-free intracameral lidocaine.  ESTIMATED BLOOD LOSS: less than 1 mL.   COMPLICATIONS:  None.   DESCRIPTION OF PROCEDURE:  The patient was identified in the holding room and transported to the operating room and placed in the supine position under the operating microscope.  The right eye was identified as the operative eye and it was prepped and draped in the usual sterile ophthalmic fashion.   A 1.0 millimeter clear-corneal paracentesis was made at the 10:30 position. 0.5 ml of preservative-free 1% lidocaine with epinephrine was injected into the anterior chamber.  The anterior chamber was filled with Healon 5 viscoelastic.  A 2.4 millimeter keratome was used to make a near-clear corneal incision at the 8:00 position.  A curvilinear capsulorrhexis was made with a cystotome and capsulorrhexis forceps.  Balanced salt solution was used to hydrodissect and hydrodelineate the nucleus.   Phacoemulsification was then used in stop and chop fashion to remove the lens nucleus and epinucleus.  The remaining  cortex was then removed using the irrigation and aspiration handpiece. Healon was then placed into the capsular bag to distend it for lens placement.  A lens was then injected into the capsular bag.  The remaining viscoelastic was aspirated.   Wounds were hydrated with balanced salt solution.  The anterior chamber was inflated to a physiologic pressure with balanced salt solution.   Intracameral vigamox 0.1 mL undiluted was injected into the eye and a drop placed onto the ocular surface.  No wound leaks were noted.  The patient was taken to the recovery room in stable condition without complications of anesthesia or surgery  Benay Pillow 06/05/2020, 7:53 AM

## 2020-06-05 NOTE — Anesthesia Postprocedure Evaluation (Signed)
Anesthesia Post Note  Patient: Katherine Shields  Procedure(s) Performed: CATARACT EXTRACTION PHACO AND INTRAOCULAR LENS PLACEMENT (IOC) RIGHT (Right Eye)     Patient location during evaluation: PACU Anesthesia Type: MAC Level of consciousness: awake and alert and oriented Pain management: satisfactory to patient Vital Signs Assessment: post-procedure vital signs reviewed and stable Respiratory status: spontaneous breathing, nonlabored ventilation and respiratory function stable Cardiovascular status: blood pressure returned to baseline and stable Postop Assessment: Adequate PO intake and No signs of nausea or vomiting Anesthetic complications: no   No complications documented.  Raliegh Ip

## 2020-06-05 NOTE — Transfer of Care (Signed)
Immediate Anesthesia Transfer of Care Note  Patient: Katherine Shields  Procedure(s) Performed: CATARACT EXTRACTION PHACO AND INTRAOCULAR LENS PLACEMENT (IOC) RIGHT (Right Eye)  Patient Location: PACU  Anesthesia Type: MAC  Level of Consciousness: awake, alert  and patient cooperative  Airway and Oxygen Therapy: Patient Spontanous Breathing and Patient connected to supplemental oxygen  Post-op Assessment: Post-op Vital signs reviewed, Patient's Cardiovascular Status Stable, Respiratory Function Stable, Patent Airway and No signs of Nausea or vomiting  Post-op Vital Signs: Reviewed and stable  Complications: No complications documented.

## 2020-06-05 NOTE — Anesthesia Preprocedure Evaluation (Signed)
Anesthesia Evaluation  Patient identified by MRN, date of birth, ID band Patient awake    Reviewed: Allergy & Precautions, H&P , NPO status , Patient's Chart, lab work & pertinent test results  Airway Mallampati: II  TM Distance: >3 FB Neck ROM: full    Dental  (+) Missing, Loose   Pulmonary former smoker,    Pulmonary exam normal breath sounds clear to auscultation       Cardiovascular hypertension, Normal cardiovascular exam Rhythm:regular Rate:Normal     Neuro/Psych    GI/Hepatic   Endo/Other    Renal/GU      Musculoskeletal   Abdominal   Peds  Hematology   Anesthesia Other Findings   Reproductive/Obstetrics                             Anesthesia Physical  Anesthesia Plan  ASA: II  Anesthesia Plan: MAC   Post-op Pain Management:    Induction:   PONV Risk Score and Plan: 2 and Treatment may vary due to age or medical condition, Midazolam and TIVA  Airway Management Planned:   Additional Equipment:   Intra-op Plan:   Post-operative Plan:   Informed Consent: I have reviewed the patients History and Physical, chart, labs and discussed the procedure including the risks, benefits and alternatives for the proposed anesthesia with the patient or authorized representative who has indicated his/her understanding and acceptance.     Dental Advisory Given  Plan Discussed with: CRNA  Anesthesia Plan Comments:         Anesthesia Quick Evaluation

## 2020-06-05 NOTE — H&P (Signed)
[facility]   Primary Care Physician:  Toni Arthurs, NP Ophthalmologist: Dr. Benay Pillow  Pre-Procedure History & Physical: HPI:  Katherine Shields is a 80 y.o. female here for cataract surgery.   Past Medical History:  Diagnosis Date  . Hyperlipidemia   . Hypertension   . Thyroid disease     Past Surgical History:  Procedure Laterality Date  . CATARACT EXTRACTION W/PHACO Left 05/22/2020   Procedure: CATARACT EXTRACTION PHACO AND INTRAOCULAR LENS PLACEMENT (IOC) LEFT 8.34 00:52.0;  Surgeon: Eulogio Bear, MD;  Location: [place];  Service: Ophthalmology;  Laterality: Left;  . COLONOSCOPY      Prior to Admission medications   Medication Sig Start Date End Date Taking? Authorizing Provider  AMLODIPINE BENZOATE PO Take by mouth daily.   Yes [provider]  aspirin 81 MG tablet Take 81 mg by mouth daily.   Yes [provider]  levothyroxine (SYNTHROID, LEVOTHROID) 25 MCG tablet Take 25 mcg by mouth daily before breakfast.   Yes [provider]  loratadine (CLARITIN) 10 MG tablet Take 10 mg by mouth daily.   Yes [provider]  losartan (COZAAR) 100 MG tablet Take 100 mg by mouth daily.  03/24/19  Yes [provider]  lovastatin (MEVACOR) 10 MG tablet Take 10 mg by mouth at bedtime.   Yes [provider]  Multiple Vitamins-Minerals (MULTIVITAMIN GUMMIES ADULT PO) Take 1 tablet by mouth daily.   Yes [provider]  Multiple Vitamins-Minerals (PRESERVISION AREDS 2 PO) Take 1 tablet by mouth daily.    Yes [provider]  oxybutynin (DITROPAN-XL) 5 MG 24 hr tablet Take 5 mg by mouth at bedtime.   Yes [provider]  raloxifene (EVISTA) 60 MG tablet Take 60 mg by mouth daily.  03/24/19  Yes [provider]    Allergies as of 03/14/2020 - Review Complete 06/29/2019  Allergen Reaction Noted  . Alendronate  07/30/2013  . Risedronate  07/30/2013  . Simvastatin  07/30/2013     Family History  Problem Relation Age of Onset  . Hypertension Father   . Breast cancer Neg Hx     Social History   Socioeconomic History  . Marital status: Widowed    Spouse name: Not on file  . Number of children: Not on file  . Years of education: Not on file  . Highest education level: Not on file  Occupational History  . Not on file  Tobacco Use  . Smoking status: Former Smoker    Quit date: 1980    Years since quitting: 42.1  . Smokeless tobacco: Never Used  Vaping Use  . Vaping Use: Never used  Substance and Sexual Activity  . Alcohol use: No  . Drug use: No  . Sexual activity: Not on file  Other Topics Concern  . Not on file  Social History Narrative  . Not on file   Social Determinants of Health   Financial Resource Strain: Not on file  Food Insecurity: Not on file  Transportation Needs: Not on file  Physical Activity: Not on file  Stress: Not on file  Social Connections: Not on file  Intimate Partner Violence: Not on file    Review of Systems: See HPI, otherwise negative ROS  Physical Exam: BP (!) 185/88   Pulse 71   Temp (!) 97 F (36.1 C) (Temporal)   Resp 16   Ht 5\' 6"  (1.676 m)   Wt 53.5 kg   SpO2 99%  BMI 19.05 kg/m  General:   Alert,  pleasant and cooperative in NAD Head:  Normocephalic and atraumatic. Respiratory:  Normal work of breathing.  Impression/Plan: Katherine Shields is here for cataract surgery.  Risks, benefits, limitations, and alternatives regarding cataract surgery have been reviewed with the patient.  Questions have been answered.  All parties agreeable.   Benay Pillow, MD  06/05/2020, 7:25 AM

## 2020-06-05 NOTE — Anesthesia Procedure Notes (Signed)
Procedure Name: MAC Date/Time: 06/05/2020 7:33 AM Performed by: Cameron Ali, CRNA Pre-anesthesia Checklist: Patient identified, Emergency Drugs available, Suction available, Timeout performed and Patient being monitored Patient Re-evaluated:Patient Re-evaluated prior to induction Oxygen Delivery Method: Nasal cannula Placement Confirmation: positive ETCO2

## 2020-06-06 ENCOUNTER — Encounter: Payer: Self-pay | Admitting: Ophthalmology

## 2020-08-16 ENCOUNTER — Other Ambulatory Visit: Payer: Self-pay

## 2020-08-16 ENCOUNTER — Ambulatory Visit
Admission: EM | Admit: 2020-08-16 | Discharge: 2020-08-16 | Disposition: A | Discharge status: Home / Self Care | Payer: Medicare Other | Attending: Physician Assistant | Admitting: Physician Assistant

## 2020-08-16 ENCOUNTER — Encounter: Payer: Self-pay | Admitting: Emergency Medicine

## 2020-08-16 DIAGNOSIS — M542 Cervicalgia: Secondary | ICD-10-CM

## 2020-08-16 DIAGNOSIS — S161XXA Strain of muscle, fascia and tendon at neck level, initial encounter: Secondary | ICD-10-CM | POA: Diagnosis not present

## 2020-08-16 MED ORDER — TRAMADOL HCL 50 MG PO TABS: 50.0000 mg | ORAL_TABLET | Freq: Two times a day (BID) | ORAL | 0 refills | Status: AC | PRN

## 2020-08-16 MED ORDER — BACLOFEN 10 MG PO TABS: 10.0000 mg | ORAL_TABLET | Freq: Three times a day (TID) | ORAL | 0 refills | Status: AC | PRN

## 2020-08-16 NOTE — ED Provider Notes (Signed)
[facility]    CSN: [id number] Arrival date & time: 08/16/20  1309      History   Chief Complaint Chief Complaint  Patient presents with  . Neck Pain    HPI Katherine Shields is a 80 y.o. female presenting with her friend for right sided neck pain since this morning.  Patient says the pain is severe, aching, and worse when rotating her neck. Has applied heat without significant improvement. Has not taken anything for pain.  She denies any injury no significant recurrent problems with her neck.  Patient is reportedly very active.  She does deny any radiation of pain to the extremities, numbness/tingling or weakness.  No pain in her chest or breathing difficulty.  She also denies any ear pain, sore throat, cough or fevers.  No other concerns today.  HPI  Past Medical History:  Diagnosis Date  . Hyperlipidemia   . Hypertension   . Thyroid disease     Patient Active Problem List   Diagnosis Date Noted  . 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 CATARACT EXTRACTION W/PHACO Left 05/22/2020   Procedure: CATARACT EXTRACTION PHACO AND INTRAOCULAR LENS PLACEMENT ([facility]) LEFT 8.34 00:52.0;  Surgeon: Eulogio Bear, MD;  Location: [place];  Service: Ophthalmology;  Laterality: Left;  . CATARACT EXTRACTION W/PHACO Right 06/05/2020   Procedure: CATARACT EXTRACTION PHACO AND INTRAOCULAR LENS PLACEMENT ([place]) RIGHT;  Surgeon: Eulogio Bear, MD;  Location: [place];  Service: Ophthalmology;  Laterality: Right;  7.92 0:45.4  . COLONOSCOPY      OB History   No obstetric history on file.      Home Medications    Prior to Admission medications   Medication Sig Start Date End Date Taking? Authorizing Provider  baclofen (LIORESAL) 10 MG tablet Take 1 tablet (10 mg total) by mouth 3 (three) times daily as needed for up to 7 days for muscle spasms. 08/16/20 08/23/20 Yes Danton Clap, PA-C  traMADol  (ULTRAM) 50 MG tablet Take 1 tablet (50 mg total) by mouth every 12 (twelve) hours as needed for up to 5 days. 08/16/20 08/21/20 Yes Laurene Footman B, PA-C  AMLODIPINE BENZOATE PO Take by mouth daily.    [provider]  aspirin 81 MG tablet Take 81 mg by mouth daily.    [provider]  levothyroxine (SYNTHROID, LEVOTHROID) 25 MCG tablet Take 25 mcg by mouth daily before breakfast.    [provider]  loratadine (CLARITIN) 10 MG tablet Take 10 mg by mouth daily.    [provider]  losartan (COZAAR) 100 MG tablet Take 100 mg by mouth daily.  03/24/19   [provider]  lovastatin (MEVACOR) 10 MG tablet Take 10 mg by mouth at bedtime.    [provider]  Multiple Vitamins-Minerals (MULTIVITAMIN GUMMIES ADULT PO) Take 1 tablet by mouth daily.    [provider]  Multiple Vitamins-Minerals (PRESERVISION AREDS 2 PO) Take 1 tablet by mouth daily.     [provider]  oxybutynin (DITROPAN-XL) 5 MG 24 hr tablet Take 5 mg by mouth at bedtime.    [provider]  raloxifene (EVISTA) 60 MG tablet Take 60 mg by mouth daily.  03/24/19   [provider]    Family History Family History  Problem Relation Age of Onset  . Hypertension Father   . Breast cancer Neg Hx     Social History Social History  Tobacco Use  . Smoking status: Former Smoker    Quit date: 1980    Years since quitting: 42.3  . Smokeless tobacco: Never Used  Vaping Use  . Vaping Use: Never used  Substance Use Topics  . Alcohol use: No  . Drug use: No     Allergies   Alendronate, Risedronate, and Simvastatin   Review of Systems Review of Systems  Constitutional: Negative for fatigue and fever.  HENT: Negative for ear pain.   Respiratory: Negative for cough and shortness of breath.   Cardiovascular: Negative for chest pain.  Gastrointestinal: Negative for nausea and vomiting.  Musculoskeletal: Positive for neck pain and neck stiffness.  Negative for arthralgias and back pain.  Skin: Negative for rash.  Neurological: Negative for weakness and numbness.     Physical Exam Triage Vital Signs ED Triage Vitals  Enc Vitals Group     BP 08/16/20 1327 (!) 146/90     Pulse Rate 08/16/20 1327 86     Resp 08/16/20 1327 16     Temp 08/16/20 1327 98.4 F (36.9 C)     Temp Source 08/16/20 1327 Oral     SpO2 08/16/20 1327 96 %     Weight --      Height --      Head Circumference --      Peak Flow --      Pain Score 08/16/20 1325 10     Pain Loc --      Pain Edu? --      Excl. in [place]? --    No data found.  Updated Vital Signs BP (!) 146/90 (BP Location: Left Arm)   Pulse 86   Temp 98.4 F (36.9 C) (Oral)   Resp 16   SpO2 96%       Physical Exam Vitals and nursing note reviewed.  Constitutional:      General: She is not in acute distress.    Appearance: Normal appearance. She is not ill-appearing or toxic-appearing.  HENT:     Head: Normocephalic and atraumatic.     Right Ear: Tympanic membrane, ear canal and external ear normal.     Left Ear: Tympanic membrane, ear canal and external ear normal.     Nose: Nose normal.     Mouth/Throat:     Mouth: Mucous membranes are moist.     Pharynx: Oropharynx is clear.  Eyes:     General: No scleral icterus.       Right eye: No discharge.        Left eye: No discharge.     Conjunctiva/sclera: Conjunctivae normal.  Cardiovascular:     Rate and Rhythm: Normal rate and regular rhythm.     Heart sounds: Normal heart sounds.  Pulmonary:     Effort: Pulmonary effort is normal. No respiratory distress.     Breath sounds: Normal breath sounds.  Musculoskeletal:     Cervical back: Neck supple. Tenderness (TTP along right SCM muscle. Reduced ROM due to pain/guarding) present.  Lymphadenopathy:     Cervical: No cervical adenopathy.  Skin:    General: Skin is dry.  Neurological:     General: No focal deficit present.     Mental Status: She is alert. Mental status is at  baseline.     Motor: No weakness.     Gait: Gait normal.  Psychiatric:        Mood and Affect: Mood normal.        Behavior: Behavior normal.  Thought Content: Thought content normal.      UC Treatments / Results  Labs (all labs ordered are listed, but only abnormal results are displayed) Labs Reviewed - No data to display  EKG   Radiology No results found.  Procedures Procedures (including critical care time)  Medications Ordered in UC Medications - No data to display  Initial Impression / Assessment and Plan / UC Course  I have reviewed the triage vital signs and the nursing notes.  Pertinent labs & imaging results that were available during my care of the patient were reviewed by me and considered in my medical decision making (see chart for details).   80 year old female presenting for atraumatic right-sided neck pain.  No associated numbness, weakness or tingling.  No red flag signs or symptoms.  Suspect cervical strain.  Treating at this time with baclofen and Tylenol.  Also given very short supply of Ultram and cautioned on taking this medication.  Controlled substance database was reviewed.  Advised to take any NSAIDs to take the lowest dose possible for the shortest amount time.  Advised warm compresses as well.  Advised to follow-up with PCP if not improving over the next week or if symptoms worsen.  ED precautions reviewed.   Final Clinical Impressions(s) / UC Diagnoses   Final diagnoses:  Acute strain of neck muscle, initial encounter  Neck pain     Discharge Instructions     Try the baclofen (muscle relaxer) and Tylenol during day. If still having pain, can try 250 mg Aleve or the ultram. Ultram and baclofen can make you a little drowsy so be careful taking.  NECK PAIN: Stressed avoiding painful activities. This can exacerbate your symptoms and make them worse.  May apply heat to the areas of pain for some relief. Use medications as directed. Be  aware of which medications make you drowsy and do not drive or operate any kind of heavy machinery while using the medication (ie pain medications or muscle relaxers). F/U with PCP for reexamination or return sooner if condition worsens or does not begin to improve over the next few days.   NECK PAIN RED FLAGS: If symptoms get worse than they are right now, you should come back sooner for re-evaluation. If you have increased numbness/ tingling or notice that the numbness/tingling is affecting the legs or saddle region, go to ER. If you ever lose continence go to ER.        ED Prescriptions    Medication Sig Dispense Auth. Provider   baclofen (LIORESAL) 10 MG tablet Take 1 tablet (10 mg total) by mouth 3 (three) times daily as needed for up to 7 days for muscle spasms. 15 tablet Laurene Footman B, PA-C   traMADol (ULTRAM) 50 MG tablet Take 1 tablet (50 mg total) by mouth every 12 (twelve) hours as needed for up to 5 days. 10 tablet Danton Clap, PA-C     I have reviewed the PDMP during this encounter.   Danton Clap, PA-C 08/16/20 1421

## 2020-08-16 NOTE — ED Triage Notes (Signed)
Pt is present today with with neck pain. Pt states that she woke up this morning and couldn't turn her neck w/o feeling pain.

## 2020-08-16 NOTE — Discharge Instructions (Signed)
Try the baclofen (muscle relaxer) and Tylenol during day. If still having pain, can try 250 mg Aleve or the ultram. Ultram and baclofen can make you a little drowsy so be careful taking.  NECK PAIN: Stressed avoiding painful activities. This can exacerbate your symptoms and make them worse.  May apply heat to the areas of pain for some relief. Use medications as directed. Be aware of which medications make you drowsy and do not drive or operate any kind of heavy machinery while using the medication (ie pain medications or muscle relaxers). F/U with PCP for reexamination or return sooner if condition worsens or does not begin to improve over the next few days.   NECK PAIN RED FLAGS: If symptoms get worse than they are right now, you should come back sooner for re-evaluation. If you have increased numbness/ tingling or notice that the numbness/tingling is affecting the legs or saddle region, go to ER. If you ever lose continence go to ER.

## 2020-12-18 IMAGING — MR MR HEAD WO/W CM
14 series · 46 of 48 positions shown · IV contrast (gadavist)
Comparison: Brain MRI 04/13/2019

CLINICAL DATA: Brain lesion.

EXAM:
MRI HEAD WITHOUT AND WITH CONTRAST
TECHNIQUE: Multiplanar, multiecho pulse sequences of the brain and surrounding
structures were obtained without and with intravenous contrast.
CONTRAST:  5mL GADAVIST GADOBUTROL 1 MMOL/ML IV SOLN

[Series 5: ax dwi_tracew · axial · 3.0mm · 0.60mm/px · z∈[-63,+89]mm · 4 of 48 slices shown]
[im 1/48]
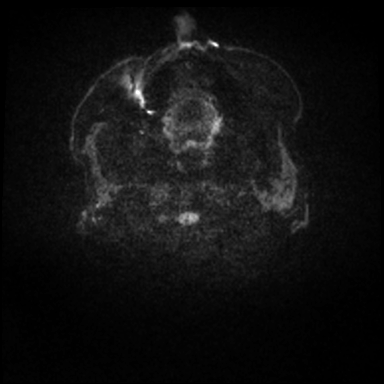
[im 16/48]
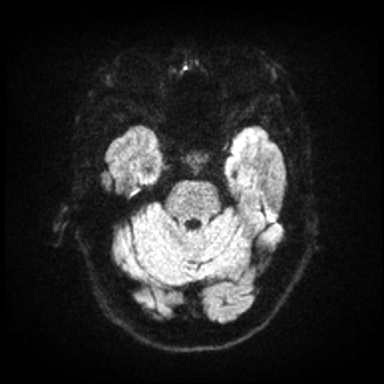
[im 32/48]
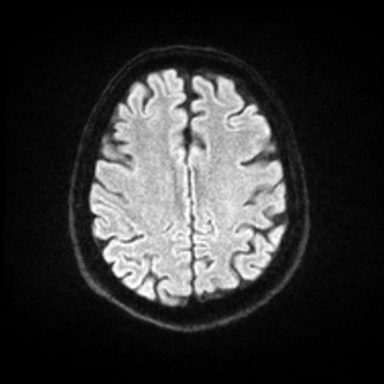
[im 48/48]
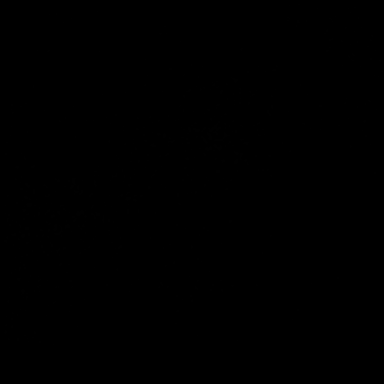

[Series 6: ax dwi_adc · axial · 3.0mm · 0.60mm/px · z∈[-63,+86]mm · 3 of 47 slices shown]
[im 1/47]
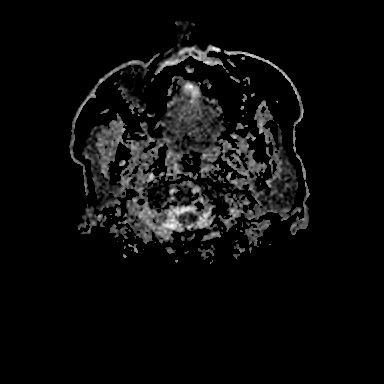
[im 24/47]
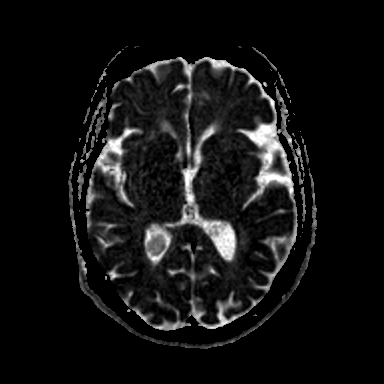
[im 47/47]
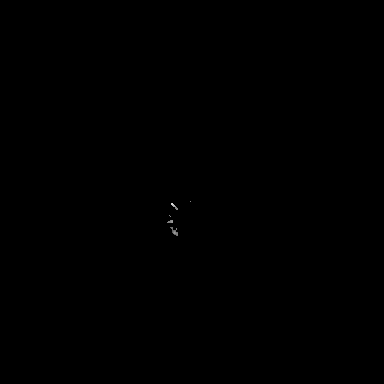

[Series 7: cor dwi_tracew · coronal · 5.0mm · 0.60mm/px · 2 of 40 slices shown]
[im 1/40]
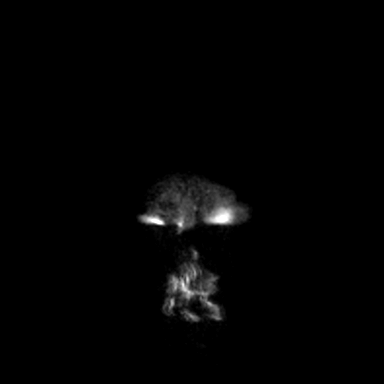
[im 40/40]
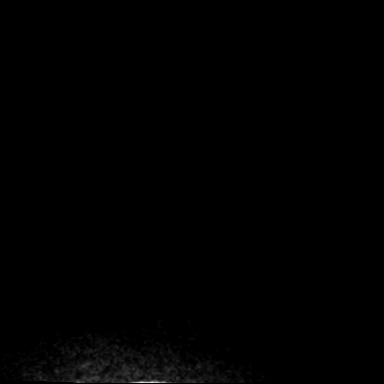

[Series 8: cor dwi_adc · coronal · 5.0mm · 0.60mm/px · 2 of 39 slices shown]
[im 1/39]
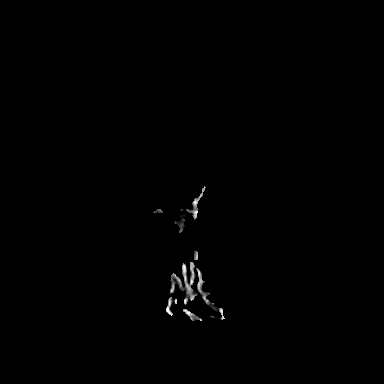
[im 39/39]
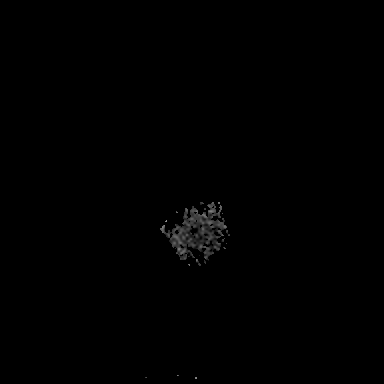

[Series 9: T1 · sagittal · 5.0mm · 0.62mm/px · 1 of 25 slices shown (1 of 2)]
[im 1/25]
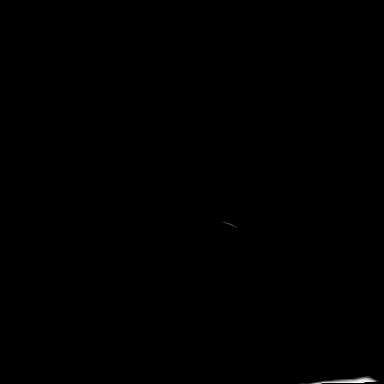

[Series 10: T2 · axial · 5.0mm · 0.53mm/px · 1 of 25 slices shown]
[im 1/25]
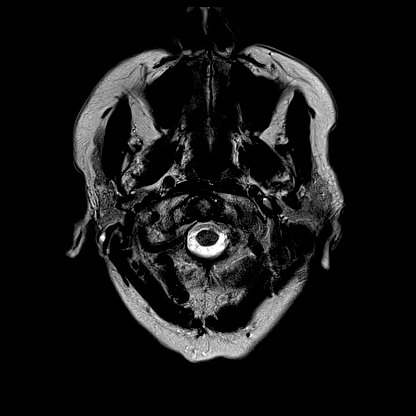

[Series 12: pha_images · axial · 3.0mm · 0.90mm/px · z∈[-72,+102]mm · 3 of 58 slices shown]
[im 1/58]
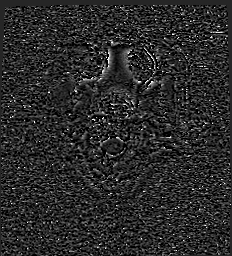
[im 29/58]
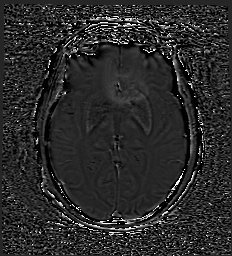
[im 58/58]
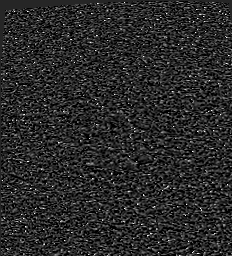

[Series 13: swi_images · axial · 3.0mm · 0.90mm/px · z∈[-72,+102]mm · 4 of 60 slices shown]
[im 1/60]
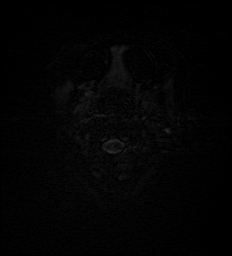
[im 20/60]
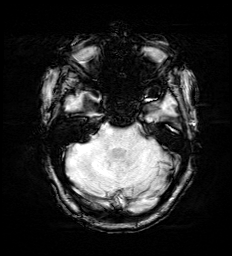
[im 40/60]
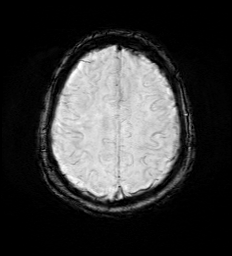
[im 60/60]
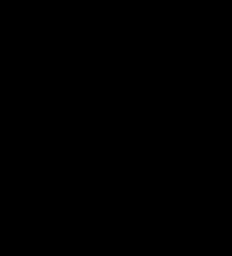

[Series 15: FLAIR · axial · 3.0mm · 0.53mm/px · z∈[-66,+94]mm · 3 of 55 slices shown]
[im 1/55]
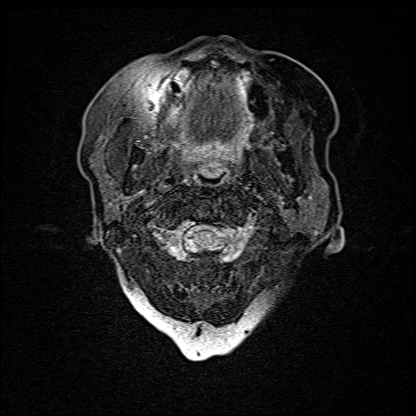
[im 28/55]
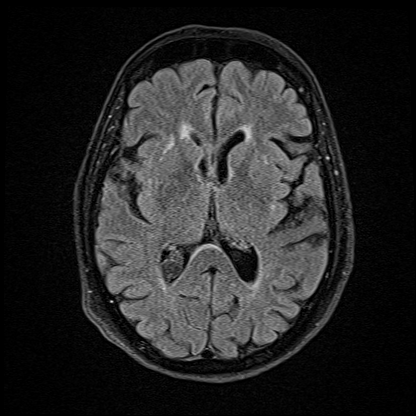
[im 55/55]
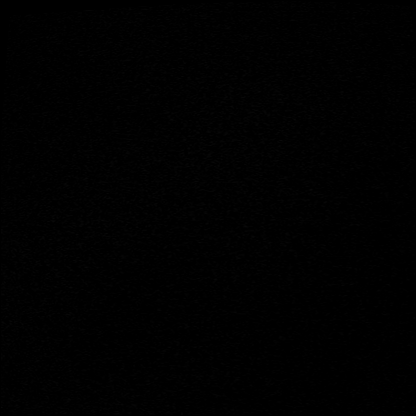

[Series 16: T1 · axial · 1.0mm · 0.98mm/px · z∈[-73,+99]mm · 8 of 170 slices shown (2 of 2)]
[im 1/170]
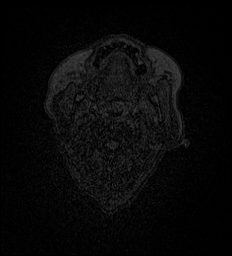
[im 19/170]
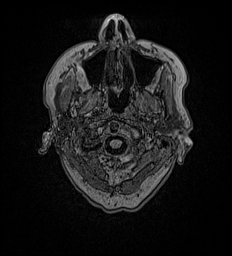
[im 57/170]
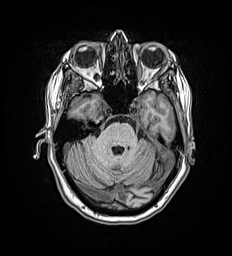
[im 76/170]
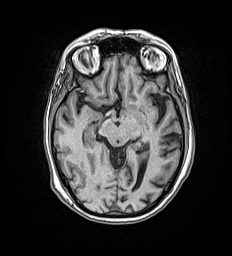
[im 94/170]
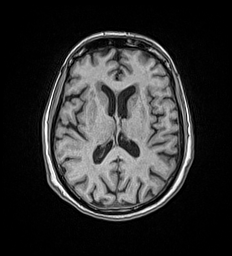
[im 113/170]
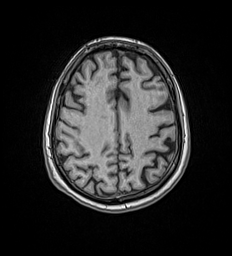
[im 151/170]
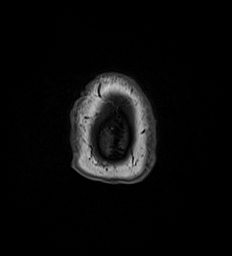
[im 170/170]
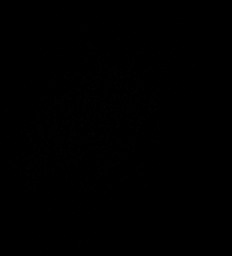

[Series 17: T2 post-contrast · coronal · 5.0mm · 0.57mm/px · 2 of 29 slices shown]
[im 1/29]
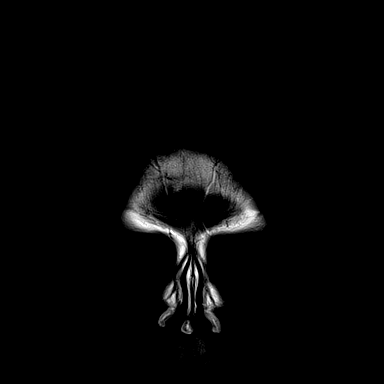
[im 29/29]
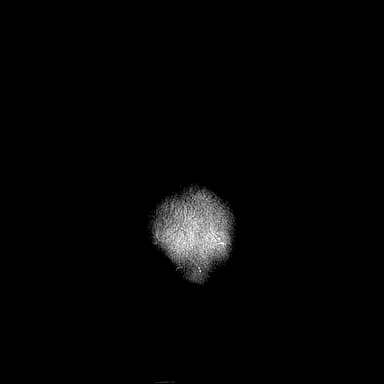

[Series 18: T1 post-contrast · axial · 1.0mm · 0.98mm/px · z∈[-73,+98]mm · 10 of 167 slices shown (1 of 3)]
[im 1/167]
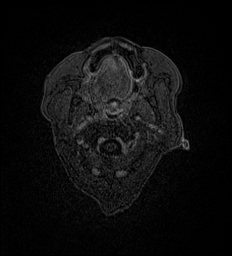
[im 19/167]
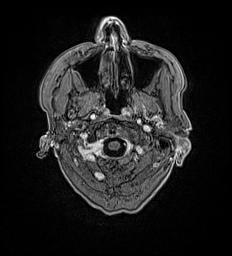
[im 37/167]
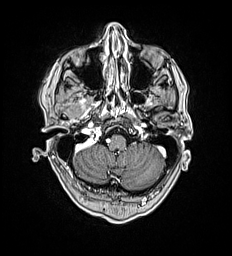
[im 56/167]
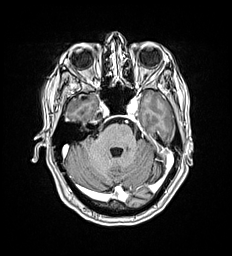
[im 74/167]
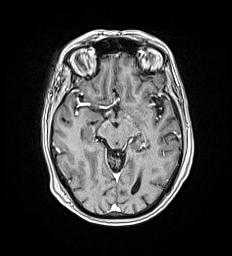
[im 93/167]
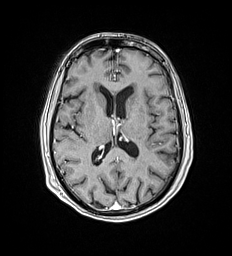
[im 111/167]
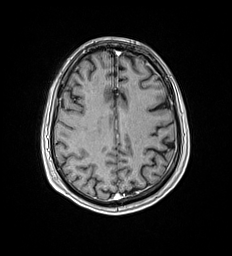
[im 130/167]
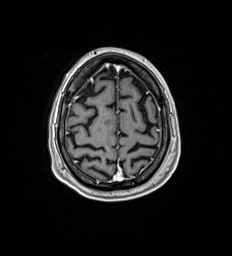
[im 148/167]
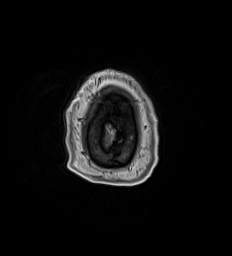
[im 167/167]
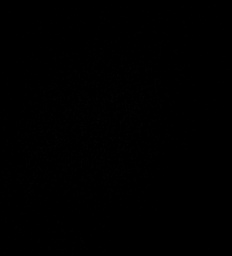

[Series 19: T1 post-contrast · coronal · 5.0mm · 0.57mm/px · 2 of 29 slices shown (2 of 3)]
[im 1/29]
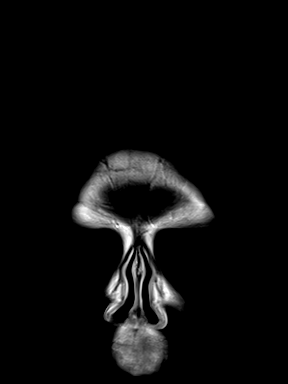
[im 29/29]
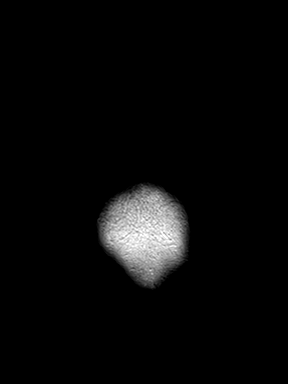

[Series 20: T1 post-contrast · sagittal · 5.0mm · 0.62mm/px · 1 of 24 slices shown (3 of 3)]
[im 1/24]
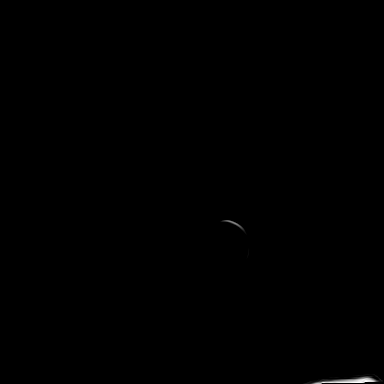

[46 of 48 positions shown; findings below may reference images not displayed]

FINDINGS: Brain:

Cerebral volume is normal.

Stable mild scattered T2/FLAIR hyperintensity within the cerebral
white matter which is nonspecific, but consistent with chronic small
vessel ischemic disease.

Previously demonstrated diffuse supratentorial and infratentorial
dural thickening and enhancement is no longer appreciated.

Unchanged subcentimeter enhancing focus immediately posterior to the
V4 right vertebral artery, just beyond the dural penetration, likely
a benign enhancing lesion of the foramen magnum (series 18, image
22).

No new focus of abnormal intracranial enhancement is identified.

There is no acute infarct.

No evidence of intracranial mass.

No chronic intracranial blood products.

No extra-axial fluid collection.

No midline shift.

Vascular: Expected proximal arterial flow voids.

Skull and upper cervical spine: No focal marrow lesion.

Sinuses/Orbits: Visualized orbits show no acute finding. Mild
ethmoid sinus mucosal thickening. Small left sphenoid sinus mucous
retention cyst. No significant mastoid effusion.
IMPRESSION: Interval resolution of the diffuse supratentorial and infratentorial
dural thickening and enhancement demonstrated on the prior brain MRI
of 04/13/2019.

Unchanged subcentimeter enhancing focus immediately posterior to the
V4 right vertebral artery, likely reflecting a benign enhancing
lesion of the foramen magnum.

No new abnormal intracranial enhancement is identified.

Stable, mild chronic small vessel ischemic disease.

Mild ethmoid sinus mucosal thickening.

Small left sphenoid sinus mucous retention cyst.
